# Patient Record
Sex: Male | Born: 1935 | Race: White | Hispanic: No | Marital: Married | Smoking: Former smoker
Health system: Southern US, Community
[De-identification: ages and names within clinical notes are randomized; demographics above are authoritative.]

## PROBLEM LIST (undated history)

## (undated) DIAGNOSIS — C189 Malignant neoplasm of colon, unspecified: Secondary | ICD-10-CM

## (undated) DIAGNOSIS — I2699 Other pulmonary embolism without acute cor pulmonale: Secondary | ICD-10-CM

## (undated) DIAGNOSIS — Z96659 Presence of unspecified artificial knee joint: Secondary | ICD-10-CM

## (undated) DIAGNOSIS — M1990 Unspecified osteoarthritis, unspecified site: Secondary | ICD-10-CM

## (undated) DIAGNOSIS — I493 Ventricular premature depolarization: Secondary | ICD-10-CM

## (undated) DIAGNOSIS — C801 Malignant (primary) neoplasm, unspecified: Secondary | ICD-10-CM

## (undated) DIAGNOSIS — G4730 Sleep apnea, unspecified: Secondary | ICD-10-CM

## (undated) DIAGNOSIS — Z9889 Other specified postprocedural states: Secondary | ICD-10-CM

## (undated) DIAGNOSIS — E039 Hypothyroidism, unspecified: Secondary | ICD-10-CM

## (undated) DIAGNOSIS — I10 Essential (primary) hypertension: Secondary | ICD-10-CM

## (undated) DIAGNOSIS — G4700 Insomnia, unspecified: Secondary | ICD-10-CM

## (undated) DIAGNOSIS — R4702 Dysphasia: Secondary | ICD-10-CM

## (undated) DIAGNOSIS — E785 Hyperlipidemia, unspecified: Secondary | ICD-10-CM

## (undated) HISTORY — PX: OTHER SURGICAL HISTORY: SHX169

## (undated) HISTORY — DX: Dysphasia: R47.02

## (undated) HISTORY — PX: THYROIDECTOMY: SHX17

## (undated) HISTORY — DX: Insomnia, unspecified: G47.00

## (undated) HISTORY — DX: Sleep apnea, unspecified: G47.30

## (undated) HISTORY — PX: CARPAL TUNNEL RELEASE: SHX101

## (undated) HISTORY — DX: Other specified postprocedural states: Z98.890

## (undated) HISTORY — DX: Ventricular premature depolarization: I49.3

## (undated) HISTORY — DX: Hypothyroidism, unspecified: E03.9

## (undated) HISTORY — DX: Essential (primary) hypertension: I10

## (undated) HISTORY — DX: Hyperlipidemia, unspecified: E78.5

## (undated) HISTORY — PX: TONSILLECTOMY AND ADENOIDECTOMY: SHX28

## (undated) HISTORY — PX: TOTAL HIP ARTHROPLASTY: SHX124

## (undated) HISTORY — DX: Presence of unspecified artificial knee joint: Z96.659

---

## 2000-04-13 ENCOUNTER — Ambulatory Visit (HOSPITAL_COMMUNITY): Admission: RE | Admit: 2000-04-13 | Discharge: 2000-04-13 | Payer: Self-pay | Admitting: Gastroenterology

## 2000-04-13 ENCOUNTER — Encounter (INDEPENDENT_AMBULATORY_CARE_PROVIDER_SITE_OTHER): Payer: Self-pay | Admitting: *Deleted

## 2000-04-21 ENCOUNTER — Encounter: Payer: Self-pay | Admitting: General Surgery

## 2000-04-25 ENCOUNTER — Inpatient Hospital Stay (HOSPITAL_COMMUNITY): Admission: RE | Admit: 2000-04-25 | Discharge: 2000-04-30 | Payer: Self-pay | Admitting: General Surgery

## 2000-04-25 ENCOUNTER — Encounter (INDEPENDENT_AMBULATORY_CARE_PROVIDER_SITE_OTHER): Payer: Self-pay | Admitting: Specialist

## 2000-09-05 ENCOUNTER — Ambulatory Visit (HOSPITAL_COMMUNITY): Admission: RE | Admit: 2000-09-05 | Discharge: 2000-09-05 | Payer: Self-pay | Admitting: Hematology & Oncology

## 2000-09-05 ENCOUNTER — Encounter: Payer: Self-pay | Admitting: Hematology & Oncology

## 2001-01-10 HISTORY — PX: APPENDECTOMY: SHX54

## 2001-01-22 ENCOUNTER — Ambulatory Visit (HOSPITAL_BASED_OUTPATIENT_CLINIC_OR_DEPARTMENT_OTHER): Admission: RE | Admit: 2001-01-22 | Discharge: 2001-01-22 | Payer: Self-pay | Admitting: Family Medicine

## 2001-04-30 ENCOUNTER — Ambulatory Visit (HOSPITAL_COMMUNITY): Admission: RE | Admit: 2001-04-30 | Discharge: 2001-04-30 | Payer: Self-pay | Admitting: Gastroenterology

## 2004-09-27 ENCOUNTER — Ambulatory Visit: Payer: Self-pay | Admitting: Pulmonary Disease

## 2005-05-03 ENCOUNTER — Ambulatory Visit (HOSPITAL_BASED_OUTPATIENT_CLINIC_OR_DEPARTMENT_OTHER): Admission: RE | Admit: 2005-05-03 | Discharge: 2005-05-03 | Payer: Self-pay | Admitting: Pulmonary Disease

## 2005-05-17 ENCOUNTER — Ambulatory Visit: Payer: Self-pay | Admitting: Pulmonary Disease

## 2005-05-23 ENCOUNTER — Ambulatory Visit: Payer: Self-pay | Admitting: Pulmonary Disease

## 2005-09-23 ENCOUNTER — Ambulatory Visit: Payer: Self-pay | Admitting: Pulmonary Disease

## 2006-01-10 DIAGNOSIS — Z9889 Other specified postprocedural states: Secondary | ICD-10-CM

## 2006-01-10 HISTORY — DX: Other specified postprocedural states: Z98.890

## 2006-07-31 LAB — LIPID PANEL
Cholesterol: 145 mg/dL (ref 0–200)
HDL: 36 mg/dL (ref 35–70)
LDL Cholesterol: 70 mg/dL
Triglycerides: 196 mg/dL — AB (ref 40–160)

## 2006-08-07 ENCOUNTER — Inpatient Hospital Stay (HOSPITAL_BASED_OUTPATIENT_CLINIC_OR_DEPARTMENT_OTHER): Admission: RE | Admit: 2006-08-07 | Discharge: 2006-08-07 | Payer: Self-pay | Admitting: Cardiovascular Disease

## 2006-08-07 HISTORY — PX: CARDIAC CATHETERIZATION: SHX172

## 2007-04-18 LAB — BASIC METABOLIC PANEL: Glucose: 125 mg/dL

## 2008-04-08 HISTORY — PX: TOTAL KNEE ARTHROPLASTY: SHX125

## 2009-08-17 ENCOUNTER — Ambulatory Visit: Payer: Self-pay | Admitting: Cardiovascular Disease

## 2010-01-27 ENCOUNTER — Encounter: Payer: Self-pay | Admitting: Cardiovascular Disease

## 2010-01-27 DIAGNOSIS — Z96659 Presence of unspecified artificial knee joint: Secondary | ICD-10-CM | POA: Insufficient documentation

## 2010-01-27 DIAGNOSIS — R4702 Dysphasia: Secondary | ICD-10-CM | POA: Insufficient documentation

## 2010-01-27 DIAGNOSIS — I493 Ventricular premature depolarization: Secondary | ICD-10-CM | POA: Insufficient documentation

## 2010-01-27 DIAGNOSIS — E039 Hypothyroidism, unspecified: Secondary | ICD-10-CM | POA: Insufficient documentation

## 2010-01-27 DIAGNOSIS — I10 Essential (primary) hypertension: Secondary | ICD-10-CM | POA: Insufficient documentation

## 2010-01-27 DIAGNOSIS — E785 Hyperlipidemia, unspecified: Secondary | ICD-10-CM | POA: Insufficient documentation

## 2010-01-27 DIAGNOSIS — G4730 Sleep apnea, unspecified: Secondary | ICD-10-CM | POA: Insufficient documentation

## 2010-02-17 ENCOUNTER — Ambulatory Visit (INDEPENDENT_AMBULATORY_CARE_PROVIDER_SITE_OTHER): Payer: Medicare Other | Admitting: Cardiovascular Disease

## 2010-02-17 DIAGNOSIS — R0681 Apnea, not elsewhere classified: Secondary | ICD-10-CM

## 2010-02-17 DIAGNOSIS — I119 Hypertensive heart disease without heart failure: Secondary | ICD-10-CM

## 2010-05-20 ENCOUNTER — Encounter: Payer: Self-pay | Admitting: Cardiovascular Disease

## 2010-05-25 NOTE — H&P (Signed)
NAME:  [name], [name]                  ACCOUNT NO.:  [id number]   MEDICAL RECORD NO.:  [id number]           PATIENT TYPE:   LOCATION:                                 FACILITY:   PHYSICIAN:  Vesta Mixer, M.D. DATE OF BIRTH:  [birth year]   DATE OF ADMISSION:  DATE OF DISCHARGE:                              HISTORY & PHYSICAL   DATE OF OFFICE VISIT:  July 31, 2006.   Victor Lucero is an elderly gentleman with a history of chest pains,  hypertension, and obstructive sleep apnea.  He has had a borderline  Cardiolite study in the past and has continued to have episodes of chest  pain.  He is admitted now for heart catheterization for further  evaluation.   Victor Lucero is a middle-aged gentleman with a history of chest pains for the  past several months.  He has a history of cigarette smoking but quit  approximately 30 years ago.  He has had some intermittent episodes of  chest pain that typically occur when he is sitting.  They seem to  radiate through to his back.  They are not necessarily associated with  eating, drinking, change in position, taking a deep breath, or exercise.  We performed a stress Cardiolite study which revealed a small amount of  anterolateral redistribution.  We have tried him on an acid blocker, and  he got some initial relief, but now his chest pains have returned.  Because of these episodes of chest pain, we have decided to proceed with  heart catheterization.  He denies any PND or orthopnea.  He denies any  syncope or presyncope.   CURRENT MEDICATIONS:  1. Levothyroxine 0.15 mg a day.  2. Lisinopril/HCT 20 mg /12.5 mg a day - 1/2 tablet a day.  3. Xalatan eye drops both eyes at bedtime.  4. Tramadol 2 tablets twice a day.  5. Nabumetone 750 mg q.12 hours.  6. Osteo Bi-Flex at bedtime.  7. Aspirin 81 mg a day.  8. Prilosec once a day.  9. Simvastatin once a day.  10.Vitamin D once a day.   He is allergic to PENICILLIN, which causes a rash.   PAST MEDICAL  HISTORY:  1. Hypercholesterolemia.  2. Chest pains.  3. Hypertension.  4. Sleep apnea.   SOCIAL HISTORY:  The patient has a remote history of cigarette smoking.  The patient is retired.   FAMILY HISTORY:  Reveals that his father died at age 1 due to thyroid  cancer.  His father died of old age.  His mother died at age 68 due to a  myocardial infarction.   His review of systems was reviewed and is essentially negative, except  for as noted in the HPI.   PHYSICAL EXAMINATION:  GENERAL:  He is a middle-aged gentleman in no  acute distress.  He is alert and oriented x3, and his mood and affect  are normal.  VITAL SIGNS:  His weight is 274, blood pressure is 134/80, with heart  rate of 80.  HEENT:  Reveals 2+ carotids.  He has  no bruits,  no JVD, no thyromegaly.  LUNGS:  Clear to auscultation.  HEART:  Regular rate, S1, S2.  ABDOMEN:  Reveals good bowel sounds and is nontender.  EXTREMITIES:  Have no clubbing, cyanosis, or edema.  NEUROLOGIC:  Nonfocal.   His EKG reveals normal sinus rhythm.  It was otherwise normal.   A stress Cardiolite study reveals anterolateral ischemia.   Mr. Walgren presents with some rather atypical chest pains but a borderline  stress Cardiolite study.  Because of his persistent chest pain and the  borderline stress Cardiolite study, we will proceed with heart  catheterization.  We have discussed the risks, benefits, and options of  heart catheterization.  He understands and agrees to proceed.           ______________________________  Vesta Mixer, M.D.     PJN/MEDQ  D:  07/31/2006  T:  08/01/2006  Job:  [id number]   cc:   Almedia Balls

## 2010-05-25 NOTE — Cardiovascular Report (Signed)
NAME:  Victor Lucero, Victor Lucero                  ACCOUNT NO.:  [id number]   MEDICAL RECORD NO.:  [id number]          PATIENT TYPE:  OIB   LOCATION:  1962                         FACILITY:  [facility]   PHYSICIAN:  Vesta Mixer, M.D. DATE OF BIRTH:  07/19/1935   DATE OF PROCEDURE:  08/07/2006  DATE OF DISCHARGE:                            CARDIAC CATHETERIZATION   HISTORY:  Victor Lucero is a 75 year old gentleman with a history of chest  pains.  He had a borderline stress Cardiolite study in the past.  He is  referred now for a heart catheterization based on his symptoms.   PROCEDURE:  left heart catheterization with coronary angiography.  The  right femoral artery was easily cannulated using modified Seldinger  technique.  He had a very tortuous right iliac artery.  We eventually  had to place a long 5-French catheter in order to cannulate the  coronaries.  We did not have any support for manipulating the catheters  without the long 5-French catheter.   HEMODYNAMIC RESULTS:  LV pressure is 140/50 with an aortic pressure of  140/65.   ANGIOGRAPHY:  Left main:  The left main is fairly normal.   The left anterior descending artery is smooth and normal.  There is a  large diagonal vessel which is normal.  Left circumflex artery is a  fairly normal branch.  It gives off a very high obtuse marginal branch.  Following this, there is a proximal 20% stenosis.  This vessel is fairly  large and this 20% stenosis is not hemodynamically significant.   The second obtuse marginal is a large and normal branch.   The right coronary artery is moderate in size and is dominant.  It is  normal throughout its course.  The posterolateral branch and the  posterior descending artery are normal.   The left ventriculogram was performed in the 30 RAO position.  It  reveals overall normal left ventricular systolic function.  Ejection  fraction is approximately 65%.   COMPLICATIONS:  None.   CONCLUSIONS:  1. Minimal  coronary artery irregularities.  He has a very slight 20%      stenosis in the proximal aspect of the circumflex artery.      Otherwise his vessels are fairly normal.  He has normal left      ventricular systolic function.  His heart may be at the upper      limits of normal in size.           ______________________________  Vesta Mixer, M.D.    PJN/MEDQ  D:  08/07/2006  T:  08/07/2006  Job:  [id number]   cc:   Almedia Balls

## 2010-05-28 NOTE — Procedures (Signed)
NAME:  Victor Lucero, Victor Lucero                  ACCOUNT NO.:  [id number]   MEDICAL RECORD NO.:  [id number]          PATIENT TYPE:  OUT   LOCATION:  SLEEP CENTER                 FACILITY[facility]  [facility]   PHYSICIAN:  Marcelyn Bruins, M.D. [facility] DATE OF BIRTH:  [birth year]   DATE OF STUDY:  05/03/2005                              NOCTURNAL POLYSOMNOGRAM   REFERRING PHYSICIAN:  Dr. Marcelyn Bruins.   INDICATIONS FOR STUDY:  Hypersomnia with sleep apnea.   EPWORTH SCORE:  Nine.   SLEEP ARCHITECTURE:  The patient had total sleep time of 323 minutes with  decreased REM and never achieved slow wave sleep.  Sleep onset latency was  at the upper limits of normal and REM onset was prolonged.  Sleep efficiency  was decreased at 70%.   RESPIRATORY DATA:  The patient was found to have 203 hypopneas and 98  obstructive apneas for a respiratory disturbance index of 56 events per  hour.  The events were not positional and there was moderate to loud snoring  noted throughout.   OXYGEN DATA:  There was O2 desaturation as low as 82% with the patient's of  obstructive events.   CARDIAC DATA:  Frequent PVCs were noted throughout.   MOVEMENT/PARASOMNIAS:  The patient was found to have 80 leg jerks with  little to no sleep disruption.   IMPRESSION/RECOMMENDATIONS:  1.  Severe obstructive sleep apnea/hypopnea syndrome with a respiratory      disturbance index of 56 events per hour and O2 desaturation as low as      82%.  Treatment for this degree of sleep apnea should focus primarily on      weight loss as well as CPAP.  2.  Frequent PACs were noted throughout.  Clinical correlation is suggested.           ______________________________  Marcelyn Bruins, M.D. [facility]  Diplomate, American Board of Sleep  Medicine     KC/MEDQ  D:  05/17/2005 15:27:03  T:  05/18/2005 14:02:40  Job:  [id number]

## 2010-05-28 NOTE — H&P (Signed)
[place]. [facility]  Patient:    Victor Lucero, Victor Lucero                         MRN: [id number] Adm. Date:  [id number] Disc. Date: [id number] Attending:  Henrene Dodge CC:         Anselm Pancoast. Zachery Dakins, M.D.   History and Physical  CHIEF COMPLAINT: Lesion, tumor of cecum.  HISTORY OF PRESENT ILLNESS: Victor Lucero is a 75 year old Caucasian male referred to me by Dr. Reece Agar for excision of an abnormal mass located at the cecum or base of the appendix that was found on colonoscopy.  The patient has had bright red blood in his bowel movements and was undergoing colonoscopy by Dr. Laural Benes with findings of a bleeding diverticulum that was clipped.  Full colonoscopy was performed with a lesion that appeared to be originate from the orifice of the appendix that looked smooth and sort of benign appearance, and a biopsy was not confirmatory or even really suspicious for adenocarcinoma.  I saw the patient in the office several days later and on physical examination the patient denied any blockage, nausea, or vomiting-type symptoms.  He was having vague pain over the right lower quadrant but the pain kind of radiates to his testicles, sort of like he has got a direct inguinal hernia, and he does have a little weakness in the right inguinal area.  I recommended that we proceed on with excision of this lesion.  Hopefully we can excise it, basically removing the appendix and the tumor since it is kind of up in the big portion of the cecum, and have pathology examination performed. If this is a malignancy he will need a right colectomy.  If it is benign then hopefully that is all he would need, and it would require a lot less of an operation.  The patient is in agreement with this.  CURRENT MEDICATIONS:  1. Synthroid replacement 150 mg q.d.  2. Eye drops.  3. Hydrochlorothiazide half tablet q.d.  He denies problems with hypertension or cardiac  problems.  ALLERGIES: He states he is allergic to PENICILLIN and it causes a rash.  He underwent erythromycin and Neomycin following a GoLYTELY bowel prep in preparation for the surgery.  Past medical history is significant in that he denies family history of colon tumors and besides diverticulosis bleeding he has not had any problems with the exception that he has had some skin tumors removed and he had a thyroidectomy back in 1967.  Dr. Karie Chimera is the patients regular physician.  PHYSICAL EXAMINATION:  GENERAL: He is a pleasant, overweight male.  He appears his stated age and is in no acute distress.  VITAL SIGNS: Temperature 97.5 degrees, heart rate 80, respirations 16, blood pressure 144/82.  Height 5 feet 11 inches.  Weight 268 pounds.  HEENT: Unremarkable.  Well-healed thyroid incision.  No masses.  No supraclavicular or cervical lymphadenopathy.  LUNGS: Clear.  CARDIAC: Normal sinus rhythm.  No murmurs, rubs, or gallops.  ABDOMEN: Obese, soft.  There is no definite umbilical weakness noted.  With straining there is a little bit of a give on the right groin area but not really anything extending through the external ring or to the scrotum.  RECTAL: Examination unremarkable.  I do not appreciate any weakness on the left inguinal area.  EXTREMITIES: Unremarkable.  CNS: Physiologic.  ADMISSION IMPRESSION: Tumor of cecum, thought possibly carcinoid, thought possibly to be a  polyp originating at the base of the appendix.  PLAN: The patient will undergo excision of this and then possibly a right colectomy. DD:  04/28/00 TD:  04/30/00 Job: [id number]

## 2010-05-28 NOTE — Procedures (Signed)
[facility]  Patient:    Victor Lucero, Victor Lucero Visit Number: [id number] MRN: [id number]          Service Type: END Location: ENDO Attending Physician:  Dennison Bulla Ii Dictated by:   Verlin Grills, M.D. Proc. Date: 04/30/01 Admit Date:  04/30/2001   CC:         Al Decant. Janey Greaser, M.D.   Procedure Report  PROCEDURE:  Screening colonoscopy.  REFERRING PHYSICIAN:  Al Decant. Janey Greaser, M.D.  PROCEDURE INDICATION:  Mr. Arda Keadle is a 74 year old male born 1935-07-13.  Approximately one year ago, Mr. Canepa underwent a segmental right colon resection to remove a colonic cancer.  Mr. Uselman is due for repeat screening colonoscopy with polypectomy to prevent recurrent colon cancer.  ENDOSCOPIST:  Verlin Grills, M.D.  PREMEDICATION:  Versed 5 mg, Demerol 50 mg.  ENDOSCOPE:  Olympus pediatric colonoscope.  DESCRIPTION OF PROCEDURE:  After obtaining informed consent, Mr. Bulnes was placed in the left lateral decubitus position.  I administered intravenous Demerol and intravenous Versed to achieve conscious sedation for the procedure.  The patients blood pressure, oxygen saturation, and cardiac rhythm were monitored throughout the procedure and documented in the medical record.  Anal inspection was normal.  Digital rectal exam was normal.  The Olympus pediatric video colonoscope was introduced into the rectum and advanced to the ileal right colonic surgical anastomosis.  Colonic preparation for the exam today was satisfactory.  RECTUM:  Normal.  SIGMOID COLON AND DESCENDING COLON:  Left colonic diverticulosis.  SPLENIC FLEXURE:  Normal.  TRANSVERSE COLON:  Normal.  HEPATIC FLEXURE:  Normal.  ASCENDING COLON:  Normal.  ILEAL RIGHT COLONIC SURGICAL ANASTOMOSIS:  Normal.  ASSESSMENT:  Left colonic diverticulosis; otherwise normal proctocolonoscopy to the ileal right colonic surgical anastomosis.  No endoscopic evidence for the  presence of recurrent colorectal neoplasia.  RECOMMENDATIONS:  Repeat colonoscopy in three years. Dictated by:   Verlin Grills, M.D. Attending Physician:  Dennison Bulla Ii DD:  04/30/01 TD:  04/30/01 Job: [id number]

## 2010-05-28 NOTE — Discharge Summary (Signed)
[place]. [facility]  Patient:    Victor Lucero, Victor Lucero                         MRN: [id number] Adm. Date:  [id number] Disc. Date: [id number] Attending:  Henrene Dodge CC:         Verlin Grills, M.D.  Al Decant. Janey Greaser, M.D.   Discharge Summary  DISCHARGE DIAGNOSIS:  Carcinoma of the cecum, Dukes B.  PROCEDURE:  Right colectomy.  HISTORY OF PRESENT ILLNESS:  Mr. Jeshawn Lucero is a 75 year old slightly overweight, Caucasian male referred to me by Verlin Grills, M.D. after a recent colonoscopy for GI bleeding and noted an area that appeared to be about a 3 cm mass and looked like it was arising from the appendices orifice, that looked smooth and the biopsy was not suspicious for carcinoma.  Dr. Laural Benes gave me pictures and on review of this, we were kind of wondering if this could possibly be a carcinoid or some type of growth and we thought it definitely needed to be excised with laparotomy, but whether it was going to need a local excision or possibly a right colectomy, we could not determine. The patient really gives no history of obstructed-type symptoms, but has had intermittent discomfort down in the right groin sort of like a right inguinal hernia.  On examination, he has a little bit of weakness noted, but not that of an obvious large direct inguinal hernia.  I recommended that we do a right transverse incision and then would make the decision of whether or not he needed a larger operation after the area was excised.  His laboratory studies, of course he has been recently transfused, showed that he was not significantly anemic.  The patient was taken to surgery and had a GoLytely bowel prep and then erythromycin and Neomycin with Dr. Danella Sensing assist.  We did a kind of a [facility] type incision below the umbilicus.  The lesion, after the cecum was mobilized, was in the area opposite to the ileocecal valve area and we were able to  transect the area with a portion of the cecum.  The area was tuckered a little bit and was noted not to arise really within the appendix, but immediately adjacent to the appendix.  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  The pathologist was a little questionable whether or not there was frank malignancy or not on a frozen examination, but because of definitely some atypical cells, we elected to go ahead and do a right colectomy after extending the incision.  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 There were 20 lymph nodes in the excised specimen, none of which showed metastatic disease, and the tumor was growing into the bowel wall, but not through the bowel wall.  Postoperatively, the patient did satisfactorily, was able to void the day after surgery after his Foley catheter was removed, was started on a limited liquid diet on approximately the second postoperative day and then this was advanced and on the fifth postoperative day, he was tolerating solid food, incision appeared to be healing okay and was discharged by Dr. Ezzard Standing who was making weekend rounds.  The patient was instructed to see me in the office for a wound  check and staple removal in approximately three to four days and understands that he should not be doing any strenuous lifting for approximately four weeks.  At the time of surgery, he has a little direct weakness in the right inguinal area, but not really a large symptomatic hernia.  This will probably develop into a hernia over the future and I expect the little pain that he is having intermittently in the right lower quadrant is related to the weakness in the right inguinal area.  The patient has Tylox for postoperative pain and he resumes his  chronic medications.  He is on hydrochlorothiazide for mild hypertension and Synthroid 150 mcg daily and Pravachol. DD:  05/17/00 TD:  05/17/00 Job: [id number]

## 2010-05-28 NOTE — Op Note (Signed)
[place]. [facility]  Patient:    Victor Lucero, Victor Lucero                         MRN: [id number] Proc. Date: 04/25/00 Adm. Date:  [id number] Attending:  Henrene Dodge                           Operative Report  PREOPERATIVE DIAGNOSIS:  Cecal mass right at the base of the appendix, possible carcinoid.  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  PROCEDURES: 1. Excisional biopsy of the cecal mass. 2. Then we proceeded on with a right colectomy.  ANESTHESIA:  General anesthesia.  SURGEON:  Anselm Pancoast. Zachery Dakins, M.D.  ASSISTANT:  Donnie Coffin. Samuella Cota, M.D.  HISTORY:  Victor Lucero is a 75 year old slightly overweight Caucasian male, who was referred to me by Verlin Grills, M.D., after on a recent colonoscopy for GI bleeding the patient was noted to have a bleeding area in the sigmoid colon that was clipped, but on a full colonoscopy there was an area, about a 3 cm mass that looks like it is arising out of the base of the appendix that looked smooth, looked kind of benign, and this was biopsied without confirmation of any definite diagnosis, and the patient was referred to me.  His pictures looked like this was kind of a smooth, not the typical malignant-type appearing area, and well-localized, and I recommended that, one, we do a limited operation first and excise the area and if this was confirmed to be a malignancy or really a polyp undergoing a malignancy, then proceed on with a right colectomy.  The patient was in agreement with this. He has had some vague symptoms in the right lower quadrant, but the symptoms that he is having are more symptomatic of a developing direct inguinal hernia that is really not a significant bulge at this time, but with groin discomfort and pain going to his testicle.  I did an erythromycin-neomycin bowel prep after GoLYTELY, and he is an a.m. admission  for this planned procedure.  DESCRIPTION OF PROCEDURE:  The patient was taken to the operative suite, induction of general anesthesia, endotracheal tube, a Foley catheter was placed sterilely, and then the patient has a very large abdomen.  An appendectomy-type transverse McBurney incision or Rockey-Davis type incision was made.  He has a broad right rectus muscle and upon opening this in the peritoneal cavity, you could see the appendix.  It looked unremarkable.  The cecum itself looked negative, but when you actually had it up exposed, you could see there was definitely a little indentation tucking in the area, which was about 3-4 cm in size.  This area, we first freed up the appendiceal mesentery and then took a GI TA-60 and put it under this lesion right through the base of the appendix, then fired it, then removed the area.  On looking at it, it looked different from the colonoscopy appearance, and it looked more like an early cancer with kind of ulceration and irregular borders, where at the time of endoscopy it looked much more benign-appearing.  Dr. Clelia Croft was the pathologist, and she received the specimen and said that really on a frozen, she was not sure whether she would be able to tell whether it definitely was or was not a true malignancy.  On the frozen, however, after several  specimens, she said it was a tubulovillous adenoma with several areas of really high dysplasia and kind of left it up to [place] on whether it was an early cancer or not.  I thought that really from the external appearance with this tuck-in and what we were feeling and seeing at the time of surgery, it certainly felt and looked more like a malignancy from the outside surface, to go ahead and do a right colectomy.  We then extended the incision.  It was transverse, kind of below the umbilicus, dividing the right rectus, and carrying it just a little bit past the midline.  I then put a Thompson retractor in the  right upper quadrant, since he is large, and then we mobilized the lateral cecum, freed the hepatic flexure of the colon from the overlying abundant omentum, and then basically divided, lifting up the mesentery and then dividing the mesentery between [facility], and then these pedicles were tied singly and doubly with 2-0 silk.  After the colon was freed, we elected to do a GIA TA-60 stapled anastomosis, kind of a side-to-side with the triangulation area, and the specimen was sent for permanent exam.  The mesenteric defect was closed with interrupted sutures of 3-0 silk.  The small bowel was run in good anatomical position.  The omentum was then brought down over this anastomosis, and the inspection revealed no evidence of any bleeding.  The small bowel was in gentle loops, and then we closed the fascia with #1 PDS in the inner layer and then #1 PDS with interrupted 2-0 Prolene sutures in the anterior fascia.  He had about a quarter-size umbilical hernia that I freed up and closed this with interrupted sutures of 0 Prolene.  The skin after being irrigated was closed with staples, and a few 3-0 Vicryl sutures were placed in the deep adipose tissue.  The patient tolerated the procedure nicely and had an oral tube in the stomach.  I think we will leave him n.p.o. but not really put an NG unless he is nauseated.  Sponge and needle counts were correct.  The estimated blood loss was probably 200 cc or so, and the patient was extubated, sent to the recovery room in stable condition. DD:  04/25/00 TD:  04/26/00 Job: [id number]

## 2010-05-28 NOTE — Procedures (Signed)
[place]  Patient:    Victor Lucero, Victor Lucero                           MRN: [id number] Proc. Date: 04/13/00 Attending:  Verlin Grills, M.D. CC:         Al Decant. Janey Greaser, M.D.   Procedure Report  DATE OF BIRTH:  10-Aug-1935  REFERRING PHYSICIAN:  Al Decant. Janey Greaser, M.D.  PROCEDURE PERFORMED:  Colonoscopy.  ENDOSCOPIST:  Verlin Grills, M.D.  INDICATIONS FOR PROCEDURE:  The patient is a 75 year old male with painless hematochezia.  Mr. Segreto denies a personal or family history of colon cancer. He underwent his health maintenance flexible proctosigmoidoscopy, September 07, 1999 which revealed colonic diverticulosis but no endoscopic evidence for the presence of colorectal neoplasia.  Mr. Clos viewed our colonoscopy education film in my office.  I discussed with the patient the complications associated with colonoscopy and polypectomy including a 1 per 1000 risk of bleeding and 4 per 1000 risk of colonic rupture requiring emergency surgery.  The patient has signed the operative permit.  ALLERGIES:  PENICILLIN.  CHRONIC MEDICATIONS:  Luvox, Synthroid, Lipitor, glaucoma eyedrops, Vioxx, glucosamine and Advil.  PAST MEDICAL HISTORY:  Glaucoma, arthritis, bilateral rotator cuff tears, diverticulitis, depression and anxiety, thyroidectomy, skin cancer removal, hypertension, hyperlipidemia.  HABITS:  Mr. Kerkhoff does not smoke cigarettes.  He consumes alcohol in moderation.  SOCIAL HISTORY:  Mr. Sturgell is married.  His 27 year old wife is in fair health. His 84 year old son, 29 year old daughter, and 15 year old son are in excellent health.  FAMILY HISTORY:  Father died age 72 of heart disease.  Mother died age 66 of heart disease and Parkinsonism.  The patients 65 year old brother has Parkinsonism.  His 30 year old sister died with heart disease.  His 35 year old sister died with a brain tumor.  His 7 year old sister died of pancreatic  cancer.  PREMEDICATION:  Versed 5 mg, fentanyl 50 mcg.  ENDOSCOPE:  Olympus video colonoscope.  DESCRIPTION OF PROCEDURE:  After obtaining informed consent, the patient was placed in the left lateral decubitus position.  I administered intravenous fentanyl and intravenous Versed to achieve conscious sedation for the procedure.  The patients blood pressure, oxygen saturation and cardiac rhythm were monitored throughout the procedure and documented in the medical record.  Anal inspection was normal.  Digital rectal examination revealed a non-nodular prostate.  The Olympus pediatric video colonoscope was then introduced into the rectum and under direct vision, easily advanced to the cecum as identified by a normal-appearing ileocecal valve.  Colonic preparation for the exam today was excellent.  Rectum:  Normal.  Sigmoid colon and descending colon:  Left colonic diverticulosis.  At approximately 40 cm from the anal verge there was an inverted diverticulum with stigmata of recent bleeding suggesting a visible vessel.  This area was endoclipped.  Marland Kitchen  Splenic flexure:  Normal.  Transverse colon:  Normal.  Hepatic flexure:  Normal.  Ascending colon:  Normal.  Cecum and ileocecal valve:  Extending from the appendiceal orifice was a 2 cm polypoid lesion.  Multiple biopsies were taken.  This could either be a carcinoid tumor or an adenoma--adenocarcinoma.  This lesion will need to be surgically removed.  ASSESSMENT: 1. Extensive left colonic diverticulosis. 2. From the sigmoid colon, at 40 cm from the anal verge a bleeding inverted    diverticulum was endoclipped. 3. From the cecum, extending from the area of the appendiceal orifice, there  was a 2 cm polypoid lesion which was biopsied and will need to be removed    surgically.  This may represent a carcinoid tumor or an adenomatous-    adenocarcinomatous polyp. DD:  04/13/00 TD:  04/13/00 Job: [id number]

## 2010-09-23 ENCOUNTER — Ambulatory Visit: Payer: Medicare Other | Admitting: Cardiovascular Disease

## 2010-11-01 ENCOUNTER — Ambulatory Visit (INDEPENDENT_AMBULATORY_CARE_PROVIDER_SITE_OTHER): Payer: Medicare Other | Admitting: Cardiovascular Disease

## 2010-11-01 ENCOUNTER — Encounter: Payer: Self-pay | Admitting: Cardiovascular Disease

## 2010-11-01 DIAGNOSIS — I10 Essential (primary) hypertension: Secondary | ICD-10-CM

## 2010-11-01 DIAGNOSIS — G4730 Sleep apnea, unspecified: Secondary | ICD-10-CM

## 2010-11-01 NOTE — Progress Notes (Signed)
Victor Lucero Date of Birth  12-17-1935 [facility] [address]    [address] [place], [place]  [address] [phone]  Fax  [phone]  History of Present Illness:  By the is a 75 year old gentleman with a history of hypertension, hypothyroidism, hyperlipidemia, sleep apnea, and a recent hip and knee replacement.   Has not gotten any good sleep for the past several years. Because of the insomnia and sleep apnea, he has not had a restful night in years.  He has an appointment with Dr. Richardean Chimera to make sure that his sleep mask is fitting correctly.    Has frequent episodes of shortness breath. He thinks that it's all because he is not in good shape.   Current Outpatient Prescriptions on File Prior to Visit  Medication Sig Dispense Refill  . aspirin 81 MG tablet Take 81 mg by mouth daily.        . diphenhydrAMINE (BENADRYL) 50 MG capsule Take 50 mg by mouth every 6 (six) hours as needed.        . latanoprost (XALATAN) 0.005 % ophthalmic solution Place 1 drop into both eyes at bedtime.        Marland Kitchen levothyroxine (SYNTHROID, LEVOTHROID) 150 MCG tablet Take 150 mcg by mouth daily.        Marland Kitchen omeprazole (PRILOSEC OTC) 20 MG tablet Take 40 mg by mouth daily.        Marland Kitchen zolpidem (AMBIEN) 10 MG tablet Take 10 mg by mouth at bedtime as needed.          Allergies  Allergen Reactions  . Penicillins Rash    Past Medical History  Diagnosis Date  . Hypertension   . Hypothyroidism   . Hyperlipemia   . Sleep apnea     CPAP  . S/P TKR (total knee replacement)     R KNEE  . Dysphasia   . PVC (premature ventricular contraction)     Past Surgical History  Procedure Date  . Total knee arthroplasty 04/08/08    R KNEE  . Cardiac catheterization 08/07/06  . Thyroidectomy 1970'S  . Appendectomy 2003  . Bowel     CANCER REMOVAL  . Tonsillectomy and adenoidectomy     AGE 6    History  Smoking status  . Former Smoker -- 2.0 packs/day for 10 years  . Types: Cigarettes  . Quit date:  01/11/1975  Smokeless tobacco  . Not on file    History  Alcohol Use  . Yes    WINE/BEER OCCASIONALLY    Family History  Problem Relation Age of Onset  . Cancer Father     THYROID CA-DECEASED  . Stroke Father     DECEASED  . Heart attack Mother     DECEASED  . Hypertension Mother     DECEASED  . Parkinsonism Brother     DECEASED  . Heart attack Brother     DECEASED  . Heart attack Sister     DECEASED  . Hypertension Sister   . Cancer Sister     BRAIN TUMOR-DECEASE  . Hypertension Sister     DECEASED  . Cancer Sister     PANCREAS-DECEASED  . Hypertension Sister     Reviw of Systems:  Reviewed in the HPI.  All other systems are negative.  Physical Exam: BP 171/96  Pulse 82  Ht 5\' 11"  (1.803 m)  Wt 303 lb 6.4 oz (137.621 kg)  BMI 42.32 kg/m2 The patient is alert and oriented x 3.  The mood and affect are normal.   Skin: warm and dry.  Color is normal.    HEENT:   Normal carotids. No JVD.  Lungs: Lungs are clear.   Heart: Regular rate S1-S2.    Abdomen: Nontender. He has good bowel sounds  Extremities:  No clubbing cyanosis or edema.  Neuro:  Nonfocal.       ECG: Normal sinus rhythm. Sinus arrhythmia.  Assessment / Plan:

## 2010-11-01 NOTE — Patient Instructions (Addendum)
Your physician wants you to follow-up in: 6 month, You will receive a reminder letter in the mail two months in advance. If you don't receive a letter, please call our office to schedule the follow-up appointment.  Your physician recommends that you continue on your current medications as directed. Please refer to the Current Medication list given to you today.

## 2010-11-01 NOTE — Assessment & Plan Note (Signed)
He'll be seeing Dr. Richardean Chimera soon to check his CPAP mask.

## 2010-11-01 NOTE — Assessment & Plan Note (Signed)
About his blood pressures elevated today. It was normal with some 6 months ago. He's complaining of a lot of pain in his hip. In addition he has not been sleeping well.  He has significant insomnia and also has sleep apnea. I've encouraged him to try to get as much exercise as possible. I've advised him to try to lose some weight.  His wife has been sick and he's had to keep out all of his meals. Occasionally he'll brings it back but it's always prepared food in the quantity of food is more than he would normally eat. We'll continue with his same medications. We'll see him in 6 months. I'll be glad to see him sooner if needed.

## 2010-11-02 ENCOUNTER — Telehealth: Payer: Self-pay | Admitting: Cardiovascular Disease

## 2010-11-02 MED ORDER — LOSARTAN POTASSIUM 100 MG PO TABS: 100.0000 mg | ORAL_TABLET | Freq: Every day | ORAL | Status: DC

## 2010-11-02 NOTE — Telephone Encounter (Signed)
Pt calling to confirm medication from yesterday's OV it is called Losartan potassium 100mg  qd

## 2010-11-02 NOTE — Telephone Encounter (Signed)
Called pt back, no HCTZ in med/ losartan, MAR changed to reflect and not refilled at this time.

## 2010-12-17 ENCOUNTER — Ambulatory Visit: Payer: Medicare Other | Attending: Neurology | Admitting: Physical Therapy

## 2010-12-17 DIAGNOSIS — R42 Dizziness and giddiness: Secondary | ICD-10-CM | POA: Insufficient documentation

## 2010-12-17 DIAGNOSIS — IMO0001 Reserved for inherently not codable concepts without codable children: Secondary | ICD-10-CM | POA: Insufficient documentation

## 2010-12-17 DIAGNOSIS — R269 Unspecified abnormalities of gait and mobility: Secondary | ICD-10-CM | POA: Insufficient documentation

## 2010-12-21 ENCOUNTER — Ambulatory Visit: Payer: Medicare Other | Admitting: Physical Therapy

## 2010-12-28 ENCOUNTER — Ambulatory Visit: Payer: Medicare Other | Admitting: Physical Therapy

## 2010-12-30 ENCOUNTER — Encounter: Payer: Medicare Other | Admitting: Physical Therapy

## 2011-01-06 ENCOUNTER — Encounter: Payer: Medicare Other | Admitting: Physical Therapy

## 2011-01-07 ENCOUNTER — Encounter: Payer: Medicare Other | Admitting: Physical Therapy

## 2011-01-12 ENCOUNTER — Encounter: Payer: Medicare Other | Admitting: Physical Therapy

## 2011-01-14 ENCOUNTER — Encounter: Payer: Medicare Other | Admitting: Physical Therapy

## 2011-03-29 ENCOUNTER — Encounter: Payer: Self-pay | Admitting: Nurse Practitioner

## 2011-03-29 ENCOUNTER — Ambulatory Visit (INDEPENDENT_AMBULATORY_CARE_PROVIDER_SITE_OTHER): Payer: Medicare Other | Admitting: Nurse Practitioner

## 2011-03-29 DIAGNOSIS — R079 Chest pain, unspecified: Secondary | ICD-10-CM | POA: Insufficient documentation

## 2011-03-29 DIAGNOSIS — I493 Ventricular premature depolarization: Secondary | ICD-10-CM

## 2011-03-29 DIAGNOSIS — I10 Essential (primary) hypertension: Secondary | ICD-10-CM

## 2011-03-29 DIAGNOSIS — I4949 Other premature depolarization: Secondary | ICD-10-CM

## 2011-03-29 LAB — BASIC METABOLIC PANEL
BUN: 22 mg/dL (ref 6–23)
CO2: 23 mEq/L (ref 19–32)
Calcium: 9.4 mg/dL (ref 8.4–10.5)
Chloride: 107 mEq/L (ref 96–112)
Creatinine, Ser: 0.9 mg/dL (ref 0.4–1.5)
GFR: 83.46 mL/min (ref >60.00)
Glucose, Bld: 117 mg/dL — ABNORMAL HIGH (ref 70–99)
Potassium: 3.8 mEq/L (ref 3.5–5.1)
Sodium: 141 mEq/L (ref 135–145)

## 2011-03-29 MED ORDER — METOPROLOL TARTRATE 25 MG PO TABS: 25.0000 mg | ORAL_TABLET | Freq: Two times a day (BID) | ORAL | Status: AC

## 2011-03-29 NOTE — Progress Notes (Signed)
Victor Lucero Date of Birth: [birth year] Medical Record #[id number]  History of Present Illness: Victor Lucero is seen today for a work in visit. He is seen for Dr. Elease Hashimoto. He has a history of HTN and HLD. Has sleep apnea as well. Has had remote cardiac cath back in 2008 showing minimal irregularities. EF was normal. Last echo in 2009 showing mild LVH with impaired LV relaxation, mild aortic sclerosis/calcification, no AS, trace TR and trace MR.   He comes in today. He is here alone. He was initially quite upset in regards to seeing me today but calmed down after some talking. He really hasn't felt all that well for the past 3 months or so. He has this fleeting chest pain that will last for about a minute. Not exertional but he notes that he doesn't really exert himself. He is under a lot of stress in caring for his wife. She has a hydrocephalus and requires total care. She is having surgery for a shunt tomorrow. He does not sleep due to chronic insomnia and reports really no good sleep for the past 20 years. He does have PVC's but no syncope. He has been having some headaches, blurred vision and dizziness. Blood pressure is grossly elevated. He has not been checking it at home. Salt use is questionable. He was at his PCP's office last week and told that there was some "problem" on his EKG. We do not have those records.   Current Outpatient Prescriptions on File Prior to Visit  Medication Sig Dispense Refill  . aspirin 81 MG tablet Take 81 mg by mouth daily.        . diphenhydrAMINE (BENADRYL) 50 MG capsule Take 50 mg by mouth daily.       Marland Kitchen latanoprost (XALATAN) 0.005 % ophthalmic solution Place 1 drop into both eyes at bedtime.        Marland Kitchen levothyroxine (SYNTHROID, LEVOTHROID) 150 MCG tablet Take 150 mcg by mouth daily.        Marland Kitchen losartan (COZAAR) 100 MG tablet Take 1 tablet (100 mg total) by mouth daily.  30 tablet  5  . omeprazole (PRILOSEC OTC) 20 MG tablet Take 20 mg by mouth daily.       Marland Kitchen DISCONTD:  zolpidem (AMBIEN) 10 MG tablet Take 10 mg by mouth at bedtime as needed.          Allergies  Allergen Reactions  . Penicillins Rash    Past Medical History  Diagnosis Date  . Hypertension   . Hypothyroidism   . Hyperlipemia   . Sleep apnea     CPAP  . S/P TKR (total knee replacement)     R KNEE  . Dysphasia   . PVC (premature ventricular contraction)   . S/P cardiac catheterization 2008    with minimal irregularities and a normal EF  . Insomnia     Past Surgical History  Procedure Date  . Total knee arthroplasty 04/08/08    R KNEE  . Cardiac catheterization 08/07/06  . Thyroidectomy 1970'S  . Appendectomy 2003  . Bowel     CANCER REMOVAL  . Tonsillectomy and adenoidectomy     AGE 32    History  Smoking status  . Former Smoker -- 2.0 packs/day for 10 years  . Types: Cigarettes  . Quit date: 01/11/1975  Smokeless tobacco  . Not on file    History  Alcohol Use  . Yes    WINE/BEER OCCASIONALLY    Family History  Problem Relation Age of Onset  . Cancer Father     THYROID CA-DECEASED  . Stroke Father     DECEASED  . Heart attack Mother     DECEASED  . Hypertension Mother     DECEASED  . Parkinsonism Brother     DECEASED  . Heart attack Brother     DECEASED  . Heart attack Sister     DECEASED  . Hypertension Sister   . Cancer Sister     BRAIN TUMOR-DECEASE  . Hypertension Sister     DECEASED  . Cancer Sister     PANCREAS-DECEASED  . Hypertension Sister     Review of Systems: The review of systems is per the HPI.  All other systems were reviewed and are negative.  Physical Exam: BP 172/104  Pulse 96  Ht 5' 11.5" (1.816 m)  Wt 304 lb (137.893 kg)  BMI 41.81 kg/m2  SpO2 96% Patient is alert and in no acute distress. He is anxious and upset. He is morbidly obese. Skin is warm and dry. Color is normal.  HEENT is unremarkable. Normocephalic/atraumatic. PERRL. Sclera are nonicteric. Neck is supple. No masses. No JVD. Lungs are clear. Cardiac  exam shows a regular rate and rhythm.Occasional ectopic noted.  Abdomen is obese but soft. Extremities are without significant edema. Gait and ROM are intact. No gross neurologic deficits noted.   LABORATORY DATA: EKG today shows sinus rhythm with PVC. Tracing is reviewed with Dr. Elease Hashimoto.    Assessment / Plan:

## 2011-03-29 NOTE — Assessment & Plan Note (Signed)
He is having some fleeting chest pain. Blood pressure needs to be addressed. He is under a lot of stress with caring for his wife. We are adding Lopressor today. If his symptoms persist, he will need further testing. His last cath in 2008 showed just minimal irregularities with a normal EF. Patient is agreeable to this plan and will call if any problems develop in the interim.

## 2011-03-29 NOTE — Patient Instructions (Signed)
We need to get your blood pressure down. I am going to put you on Lopressor 25 mg two times a day  Try to monitor some blood pressures at home.  We are going to check some lab today. I will get your records from Dr. Tresa Endo.  Dr. Elease Hashimoto and I will see you in a week.  Call the [facility] office at [phone] if you have any questions, problems or concerns.

## 2011-03-29 NOTE — Assessment & Plan Note (Signed)
Blood pressure is grossly elevated. I suspect this has been going on for a while. I think this is probably the etiology for his chest pain, headaches, dizziness and blurred vision. He is under a significant amount of stress with his wife. Hopefully she will do well with her surgery tomorrow. I am starting Mylik on Lopressor 25 mg BID. Dr. Elease Hashimoto and I will see him back in a week. He will try to check some blood pressures at home. Patient is agreeable to this plan and will call if any problems develop in the interim.

## 2011-03-29 NOTE — Assessment & Plan Note (Signed)
He has chronic PVC's. Perhaps this is what his PCP saw. We will try to get his records from his last visit. Doesn't seem to be that bothered by him. I am adding Lopressor today to help with his blood pressure.

## 2011-04-05 ENCOUNTER — Ambulatory Visit (INDEPENDENT_AMBULATORY_CARE_PROVIDER_SITE_OTHER): Payer: Medicare Other | Admitting: Nurse Practitioner

## 2011-04-05 ENCOUNTER — Encounter: Payer: Self-pay | Admitting: Nurse Practitioner

## 2011-04-05 DIAGNOSIS — I10 Essential (primary) hypertension: Secondary | ICD-10-CM

## 2011-04-05 DIAGNOSIS — G4730 Sleep apnea, unspecified: Secondary | ICD-10-CM

## 2011-04-05 DIAGNOSIS — R079 Chest pain, unspecified: Secondary | ICD-10-CM

## 2011-04-05 NOTE — Assessment & Plan Note (Signed)
Still with some chest tightness with exertion. Will update his stress test. Blood pressure is doing much better. Patient is agreeable to this plan and will call if any problems develop in the interim.

## 2011-04-05 NOTE — Assessment & Plan Note (Signed)
Blood pressure is down 30 points with the metoprolol. We will continue.

## 2011-04-05 NOTE — Patient Instructions (Addendum)
Your blood pressure looks better.  We are going to arrange for a stress test to further evaluate your chest pain  Dr. Elease Hashimoto will see you back in a month  Call the [facility] office at [phone] if you have any questions, problems or concerns.

## 2011-04-05 NOTE — Assessment & Plan Note (Signed)
This is a chronic issue along with his chronic insomnia. He says he is going to see Dr. Earl Gala next month for evaluation.

## 2011-04-05 NOTE — Progress Notes (Signed)
Victor Lucero [place] Date of Birth: 1935-07-03 Medical Record #[id number]  History of Present Illness: Victor Lucero is seen today for a follow up visit. He is seen for Dr. Elease Hashimoto. He has a history of HTN, HLD and sleep apnea. He has had remote cath back in 2008 showing minimal coronary irregularities. EF was normal. Last echo in 2009 showing mild LVH with impaired LV relaxation, mild aortic sclerosis/calcification, no AS, trace TR and trace MR. I saw him a week ago. He had multiple complaints. Blood pressure was grossly elevated. We started Metoprolol.  He comes back today. He is here alone. He is doing a little better. His headaches and vision are improving. Blood pressure is coming down. Still has a little chest tightness when he walks. Remains stressed in caring for his wife with a hydrocephalus. He also has chronic sleep insomnia and never really sleeps. He feels ok on his current medicines.  Current Outpatient Prescriptions on File Prior to Visit  Medication Sig Dispense Refill  . aspirin 81 MG tablet Take 81 mg by mouth daily.        . diphenhydrAMINE (BENADRYL) 50 MG capsule Take 50 mg by mouth daily.       Marland Kitchen latanoprost (XALATAN) 0.005 % ophthalmic solution Place 1 drop into both eyes at bedtime.        Marland Kitchen levothyroxine (SYNTHROID, LEVOTHROID) 150 MCG tablet Take 150 mcg by mouth daily.        Marland Kitchen losartan (COZAAR) 100 MG tablet Take 1 tablet (100 mg total) by mouth daily.  30 tablet  5  . metoprolol tartrate (LOPRESSOR) 25 MG tablet Take 1 tablet (25 mg total) by mouth 2 (two) times daily.  60 tablet  11  . omeprazole (PRILOSEC OTC) 20 MG tablet Take 20 mg by mouth daily.       Marland Kitchen DISCONTD: zolpidem (AMBIEN) 10 MG tablet Take 10 mg by mouth at bedtime as needed.          Allergies  Allergen Reactions  . Penicillins Rash    Past Medical History  Diagnosis Date  . Hypertension   . Hypothyroidism   . Hyperlipemia   . Sleep apnea     CPAP  . S/P TKR (total knee replacement)     R KNEE  .  Dysphasia   . PVC (premature ventricular contraction)   . S/P cardiac catheterization 2008    with minimal irregularities and a normal EF  . Insomnia     Past Surgical History  Procedure Date  . Total knee arthroplasty 04/08/08    R KNEE  . Cardiac catheterization 08/07/06  . Thyroidectomy 1970'S  . Appendectomy 2003  . Bowel     CANCER REMOVAL  . Tonsillectomy and adenoidectomy     AGE 76    History  Smoking status  . Former Smoker -- 2.0 packs/day for 10 years  . Types: Cigarettes  . Quit date: 01/11/1975  Smokeless tobacco  . Not on file    History  Alcohol Use  . Yes    WINE/BEER OCCASIONALLY    Family History  Problem Relation Age of Onset  . Cancer Father     THYROID CA-DECEASED  . Stroke Father     DECEASED  . Heart attack Mother     DECEASED  . Hypertension Mother     DECEASED  . Parkinsonism Brother     DECEASED  . Heart attack Brother     DECEASED  . Heart attack Sister  DECEASED  . Hypertension Sister   . Cancer Sister     BRAIN TUMOR-DECEASE  . Hypertension Sister     DECEASED  . Cancer Sister     PANCREAS-DECEASED  . Hypertension Sister     Review of Systems: The review of systems is positive for chest tightness with exertion.  All other systems were reviewed and are negative.  Physical Exam: BP 148/70  Pulse 56  Ht 5' 11.5" (1.816 m)  Wt 303 lb (137.44 kg)  BMI 41.67 kg/m2 Patient is very pleasant and in no acute distress. He is obese. Skin is warm and dry. Color is normal.  HEENT is unremarkable. Normocephalic/atraumatic. PERRL. Sclera are nonicteric. Neck is supple. No masses. No JVD. Lungs are clear. Cardiac exam shows a regular rate and rhythm. Abdomen is obese but soft. Extremities are without edema. Gait and ROM are intact. No gross neurologic deficits noted.  LABORATORY DATA:   Assessment / Plan:

## 2011-04-14 ENCOUNTER — Ambulatory Visit (HOSPITAL_COMMUNITY): Payer: Medicare Other | Attending: Cardiovascular Disease | Admitting: Radiology

## 2011-04-14 ENCOUNTER — Encounter (HOSPITAL_COMMUNITY): Payer: Self-pay

## 2011-04-14 DIAGNOSIS — Z87891 Personal history of nicotine dependence: Secondary | ICD-10-CM | POA: Insufficient documentation

## 2011-04-14 DIAGNOSIS — R002 Palpitations: Secondary | ICD-10-CM | POA: Insufficient documentation

## 2011-04-14 DIAGNOSIS — Z8249 Family history of ischemic heart disease and other diseases of the circulatory system: Secondary | ICD-10-CM | POA: Insufficient documentation

## 2011-04-14 DIAGNOSIS — R0789 Other chest pain: Secondary | ICD-10-CM | POA: Insufficient documentation

## 2011-04-14 DIAGNOSIS — I10 Essential (primary) hypertension: Secondary | ICD-10-CM | POA: Insufficient documentation

## 2011-04-14 DIAGNOSIS — R079 Chest pain, unspecified: Secondary | ICD-10-CM

## 2011-04-14 DIAGNOSIS — I4949 Other premature depolarization: Secondary | ICD-10-CM

## 2011-04-14 DIAGNOSIS — E785 Hyperlipidemia, unspecified: Secondary | ICD-10-CM | POA: Insufficient documentation

## 2011-04-14 MED ORDER — REGADENOSON 0.4 MG/5ML IV SOLN
0.4000 mg | Freq: Once | INTRAVENOUS | Status: AC
  Administered 2011-04-14: 0.4 mg via INTRAVENOUS

## 2011-04-14 MED ORDER — TECHNETIUM TC 99M TETROFOSMIN IV KIT
33.0000 | PACK | Freq: Once | INTRAVENOUS | Status: AC | PRN
  Administered 2011-04-14: 33 via INTRAVENOUS

## 2011-04-14 NOTE — Progress Notes (Signed)
[facility] SITE 3 NUCLEAR MED [address] [place] [address] [phone]  Cardiology Nuclear Med Study  Victor Lucero is a 76 y.o. male     MRN : [id number]     DOB: 1935-06-15  Procedure Date: 04/14/2011  Nuclear Med Background Indication for Stress Test:  Evaluation for Ischemia History: '08 Heart Cath: Minimum CAD, EF:65%, '09 ECHO: mild LVH Cardiac Risk Factors: Family History - CAD, History of Smoking, Hypertension and Lipids  Symptoms:  Chest Pain, Chest Tightness with Exertion and Palpitations   Nuclear Pre-Procedure Caffeine/Decaff Intake:  None NPO After: 8:00pm   Lungs:  clear O2 Sat: 97% on room air. IV 0.9% NS with Angio Cath:  22g  IV Site: R Antecubital  IV Started by:  Burna Mortimer Deal, RT-N  Chest Size (in):  54 Cup Size: n/a  Height: 5\' 11"  (1.803 m)  Weight:  305 lb (138.347 kg)  BMI:  Body mass index is 42.54 kg/(m^2). Tech Comments:  Last dose of Metoprolol at 9:00pm 04/13/11. W.Deal,RT-N    Nuclear Med Study 1 or 2 day study: 2 day  Stress Test Type:  Eugenie Lucero  Reading MD: [name], MD  Order Authorizing Provider:  [name] P. Nahser,MD  Resting Radionuclide: Technetium 36m Tetrofosmin  Resting Radionuclide Dose: 32.0 mCi           On 04-18-11  Stress Radionuclide:  Technetium 35m Tetrofosmin  Stress Radionuclide Dose: 33.0 mCi             On 04-14-11                      Stress Protocol Rest HR: 57 Stress HR: 60  Rest BP: 148/81 Stress BP: 140/62  Exercise Time (min): n/a METS: n/a   Predicted Max HR: 147 bpm % Max HR: 40.82 bpm Rate Pressure Product: 8880   Dose of Adenosine (mg):  n/a Dose of Lexiscan: 0.4 mg  Dose of Atropine (mg): n/a Dose of Dobutamine: n/a mcg/kg/min (at max HR)  Stress Test Technologist: Milana Na, EMT-P  Nuclear Technologist:  Domenic Polite, CNMT     Rest Procedure:  Myocardial perfusion imaging was performed at rest 45 minutes following the intravenous administration of Technetium 72m  Tetrofosmin. Rest ECG: Sinus Bradycardia with PVCS  Stress Procedure:  The patient received IV Lexiscan 0.4 mg over 15-seconds.  Technetium 34m Tetrofosmin injected at 30-seconds.  There were no significant changes with Lexiscan.  Quantitative spect images were obtained after a 45 minute delay. Stress ECG: No significant change from baseline ECG and No significant ST segment change suggestive of ischemia.  QPS Raw Data Images:  Acquisition technically good; LVE. Stress Images:  Normal homogeneous uptake in all areas of the myocardium. Rest Images:  Normal homogeneous uptake in all areas of the myocardium. Subtraction (SDS):  No evidence of ischemia. Transient Ischemic Dilatation (Normal <1.22):  1.17 Lung/Heart Ratio (Normal <0.45):  0.37  Quantitative Gated Spect Images QGS EDV:  NA QGS ESV:  NA  Impression Exercise Capacity:  Lexiscan with no exercise. BP Response:  Normal blood pressure response. Clinical Symptoms:  There is dyspnea and chest pressure. ECG Impression:  No significant ST segment change suggestive of ischemia. Comparison with Prior Nuclear Study: No images to compare  Overall Impression:  Normal stress nuclear study.  LV Ejection Fraction: Study not gated.  LV Wall Motion:  Study not gated due to ectopy.   Victor Lucero

## 2011-04-18 ENCOUNTER — Ambulatory Visit (HOSPITAL_COMMUNITY): Payer: Medicare Other | Attending: Cardiology | Admitting: Radiology

## 2011-04-18 DIAGNOSIS — R0989 Other specified symptoms and signs involving the circulatory and respiratory systems: Secondary | ICD-10-CM

## 2011-04-18 MED ORDER — TECHNETIUM TC 99M TETROFOSMIN IV KIT
32.0000 | PACK | Freq: Once | INTRAVENOUS | Status: AC | PRN
  Administered 2011-04-18: 32 via INTRAVENOUS

## 2011-04-26 ENCOUNTER — Telehealth: Payer: Self-pay | Admitting: Cardiovascular Disease

## 2011-04-26 NOTE — Telephone Encounter (Signed)
Intolerant of metoprolol, dont want to take anymore, states he falls asleep when sits down then his sleep apnea wakes him up, SOB, CP continues, BP 171/87 p 63, Dr Elease Hashimoto wants him seen, placed on schedule in morning.

## 2011-04-26 NOTE — Telephone Encounter (Signed)
Patient request return call at [phone]  Patient thinks he may be having side affects from BP meds; very sleepy, fatigue. He can be reached at [phone].

## 2011-04-27 ENCOUNTER — Ambulatory Visit (HOSPITAL_COMMUNITY): Payer: Medicare Other | Attending: Cardiovascular Disease

## 2011-04-27 ENCOUNTER — Ambulatory Visit (INDEPENDENT_AMBULATORY_CARE_PROVIDER_SITE_OTHER): Payer: Medicare Other | Admitting: Cardiovascular Disease

## 2011-04-27 ENCOUNTER — Encounter: Payer: Self-pay | Admitting: Cardiovascular Disease

## 2011-04-27 ENCOUNTER — Other Ambulatory Visit: Payer: Self-pay

## 2011-04-27 VITALS — BP 150/82 | HR 60 | Ht 71.0 in | Wt 309.4 lb

## 2011-04-27 DIAGNOSIS — G4730 Sleep apnea, unspecified: Secondary | ICD-10-CM

## 2011-04-27 DIAGNOSIS — E785 Hyperlipidemia, unspecified: Secondary | ICD-10-CM

## 2011-04-27 DIAGNOSIS — I10 Essential (primary) hypertension: Secondary | ICD-10-CM

## 2011-04-27 DIAGNOSIS — R079 Chest pain, unspecified: Secondary | ICD-10-CM

## 2011-04-27 DIAGNOSIS — E039 Hypothyroidism, unspecified: Secondary | ICD-10-CM

## 2011-04-27 DIAGNOSIS — R0602 Shortness of breath: Secondary | ICD-10-CM

## 2011-04-27 MED ORDER — NEBIVOLOL HCL 10 MG PO TABS: 10.0000 mg | ORAL_TABLET | Freq: Every day | ORAL | Status: DC

## 2011-04-27 NOTE — Assessment & Plan Note (Signed)
He's not had any recurrent episodes of chest pain. His stress Myoview recently was negative for ischemia.

## 2011-04-27 NOTE — Patient Instructions (Signed)
Your physician has recommended you make the following change in your medication: Stop your metoprolol and start Bystolic 10mg  daily  Your physician has requested that you have an echocardiogram. Echocardiography is a painless test that uses sound waves to create images of your heart. It provides your doctor with information about the size and shape of your heart and how well your heart's chambers and valves are working. This procedure takes approximately one hour. There are no restrictions for this procedure.  Your physician recommends that you schedule a follow-up appointment in: 3 months.

## 2011-04-27 NOTE — Progress Notes (Addendum)
Victor Lucero Date of Birth  04/29/1935 [facility] [address]    [address] [place], [place]  [address] [phone]  Fax  [phone]  Problem List: 1. Hypertension 2. Hypothyroidism 3. Hyperlipidemia 4. Obesity 5. Sleep apnea-currently on CPAP 6. History of  chest pain-normal cardiac catheterization in 2008. Normal coronary arteries with normal left ventricle systolic function-recent stress Myoview study revealed no evidence of ischemia. We cannot determine the left ventricular systolic function because the study was not able to be gated.   History of Present Illness:  By the is a 76 year old gentleman with a history of hypertension, hypothyroidism, hyperlipidemia, sleep apnea, and a recent hip and knee replacement.   Has not gotten any good sleep for the past several years. Because of the insomnia and sleep apnea, he has not had a restful night in years.  He sees Dr. Benjaman Kindler for evaluation of his sleep apnea. He uses the nasal pillows. He does not see any benefit from the CPAP device.  He becomes very dyspneic with minimal exertion. Coming in to the office today his O2 saturations were 97%. With ambulation his sats dropped to 88%. They then returned up to 96% after he stopped and rested for a while.  He's gained 20 pounds over the past 6 months.   Has frequent episodes of shortness breath. He thinks that it's all because he is not in good shape.   Current Outpatient Prescriptions on File Prior to Visit  Medication Sig Dispense Refill  . aspirin 81 MG tablet Take 81 mg by mouth daily.        Marland Kitchen azelastine (ASTELIN) 137 MCG/SPRAY nasal spray Place 2 sprays into the nose 2 (two) times daily. Use in each nostril as directed      . diphenhydrAMINE (BENADRYL) 50 MG capsule Take 50 mg by mouth daily.       . fluticasone (FLONASE) 50 MCG/ACT nasal spray Place 2 sprays into the nose daily.      Marland Kitchen latanoprost (XALATAN) 0.005 % ophthalmic solution Place 1 drop into both eyes at  bedtime.        Marland Kitchen levothyroxine (SYNTHROID, LEVOTHROID) 150 MCG tablet Take 150 mcg by mouth daily.        Marland Kitchen losartan (COZAAR) 100 MG tablet Take 1 tablet (100 mg total) by mouth daily.  30 tablet  5  . metoprolol tartrate (LOPRESSOR) 25 MG tablet Take 1 tablet (25 mg total) by mouth 2 (two) times daily.  60 tablet  11  . omeprazole (PRILOSEC OTC) 20 MG tablet Take 20 mg by mouth 2 (two) times daily.       Marland Kitchen DISCONTD: zolpidem (AMBIEN) 10 MG tablet Take 10 mg by mouth at bedtime as needed.          Allergies  Allergen Reactions  . Penicillins Rash    Past Medical History  Diagnosis Date  . Hypertension   . Hypothyroidism   . Hyperlipemia   . Sleep apnea     CPAP  . S/P TKR (total knee replacement)     R KNEE  . Dysphasia   . PVC (premature ventricular contraction)   . S/P cardiac catheterization 2008    with minimal irregularities and a normal EF  . Insomnia     Past Surgical History  Procedure Date  . Total knee arthroplasty 04/08/08    R KNEE  . Cardiac catheterization 08/07/06  . Thyroidectomy 1970'S  . Appendectomy 2003  . Bowel  CANCER REMOVAL  . Tonsillectomy and adenoidectomy     AGE 31    History  Smoking status  . Former Smoker -- 2.0 packs/day for 10 years  . Types: Cigarettes  . Quit date: 01/11/1975  Smokeless tobacco  . Not on file    History  Alcohol Use  . Yes    WINE/BEER OCCASIONALLY    Family History  Problem Relation Age of Onset  . Cancer Father     THYROID CA-DECEASED  . Stroke Father     DECEASED  . Heart attack Mother     DECEASED  . Hypertension Mother     DECEASED  . Parkinsonism Brother     DECEASED  . Heart attack Brother     DECEASED  . Heart attack Sister     DECEASED  . Hypertension Sister   . Cancer Sister     BRAIN TUMOR-DECEASE  . Hypertension Sister     DECEASED  . Cancer Sister     PANCREAS-DECEASED  . Hypertension Sister     Reviw of Systems:  Reviewed in the HPI.  All other systems are  negative.  Physical Exam: BP 150/82  Pulse 60  Ht 5\' 11"  (1.803 m)  Wt 309 lb 6.4 oz (140.343 kg)  BMI 43.15 kg/m2  SpO2 87% The patient is alert and oriented x 3.  The mood and affect are normal.   Skin: warm and dry.  Color is normal.    HEENT:   Normal carotids. No JVD.  Lungs: Lungs are clear.   Heart: Regular rate S1-S2.    Abdomen: Nontender. He has good bowel sounds  Extremities:  No clubbing cyanosis or edema.  Neuro:  Nonfocal.       ECG: Normal sinus rhythm with frequent premature beats in a bigeminal pattern. These are likely to be premature ventricular contractions although I cannot exclude premature atrial contractions.  Assessment / Plan:

## 2011-04-27 NOTE — Assessment & Plan Note (Addendum)
Victor Lucero Presents with persistent hypertension. He clearly is not tolerating the metoprolol. He's having lots of generalized fatigue and somnolence. I suspect that some of this may be due to his sleep apnea. He is diagnosed with apnea and states that his nasal pillows did not help him at night.  We will stop the metoprolol and start him on Bystolic 10 mg a day.  It is clear that he also eats excessive salt. He goes out to eat almost every meal. I've encouraged him to try to learn to cook as a method of eating a  lower salt diet.  I think a lot of this also may be due to his obesity. I've encouraged him to lose 5-10 pounds for seen again in 3 months.

## 2011-04-27 NOTE — Assessment & Plan Note (Signed)
He has  sleep apnea and clearly states that the CPAP apparatus is not performed normally. I'm worried that he may have pulmonary hypertension. We'll get an echocardiogram for further evaluation of his left ventricular systolic function and to evaluate his pulmonary pressures.

## 2011-05-09 ENCOUNTER — Ambulatory Visit: Payer: Medicare Other | Admitting: Cardiovascular Disease

## 2011-07-26 ENCOUNTER — Ambulatory Visit: Payer: Medicare Other | Admitting: Cardiovascular Disease

## 2011-07-27 ENCOUNTER — Ambulatory Visit: Payer: Medicare Other | Admitting: Cardiovascular Disease

## 2013-07-17 ENCOUNTER — Other Ambulatory Visit: Payer: Self-pay | Admitting: Neurosurgery

## 2013-07-17 ENCOUNTER — Other Ambulatory Visit: Payer: Self-pay | Admitting: Internal Medicine

## 2013-07-17 DIAGNOSIS — IMO0002 Reserved for concepts with insufficient information to code with codable children: Secondary | ICD-10-CM

## 2013-07-26 ENCOUNTER — Ambulatory Visit
Admission: RE | Admit: 2013-07-26 | Discharge: 2013-07-26 | Disposition: A | Discharge status: Home / Self Care | Payer: Medicare Other | Source: Ambulatory Visit | Attending: Neurosurgery | Admitting: Neurosurgery

## 2013-07-26 DIAGNOSIS — IMO0002 Reserved for concepts with insufficient information to code with codable children: Secondary | ICD-10-CM

## 2014-12-14 ENCOUNTER — Emergency Department (HOSPITAL_BASED_OUTPATIENT_CLINIC_OR_DEPARTMENT_OTHER)
Admission: EM | Admit: 2014-12-14 | Discharge: 2014-12-14 | Disposition: A | Discharge status: Home / Self Care | Payer: Medicare Other | Attending: Emergency Medicine | Admitting: Emergency Medicine

## 2014-12-14 ENCOUNTER — Encounter (HOSPITAL_BASED_OUTPATIENT_CLINIC_OR_DEPARTMENT_OTHER): Payer: Self-pay

## 2014-12-14 DIAGNOSIS — Z7901 Long term (current) use of anticoagulants: Secondary | ICD-10-CM | POA: Insufficient documentation

## 2014-12-14 DIAGNOSIS — Z85038 Personal history of other malignant neoplasm of large intestine: Secondary | ICD-10-CM | POA: Diagnosis not present

## 2014-12-14 DIAGNOSIS — K922 Gastrointestinal hemorrhage, unspecified: Secondary | ICD-10-CM | POA: Insufficient documentation

## 2014-12-14 DIAGNOSIS — Z9889 Other specified postprocedural states: Secondary | ICD-10-CM | POA: Insufficient documentation

## 2014-12-14 DIAGNOSIS — Z7951 Long term (current) use of inhaled steroids: Secondary | ICD-10-CM | POA: Insufficient documentation

## 2014-12-14 DIAGNOSIS — Z87891 Personal history of nicotine dependence: Secondary | ICD-10-CM | POA: Diagnosis not present

## 2014-12-14 DIAGNOSIS — Z8739 Personal history of other diseases of the musculoskeletal system and connective tissue: Secondary | ICD-10-CM | POA: Diagnosis not present

## 2014-12-14 DIAGNOSIS — Z79899 Other long term (current) drug therapy: Secondary | ICD-10-CM | POA: Diagnosis not present

## 2014-12-14 DIAGNOSIS — G4730 Sleep apnea, unspecified: Secondary | ICD-10-CM | POA: Diagnosis not present

## 2014-12-14 DIAGNOSIS — E039 Hypothyroidism, unspecified: Secondary | ICD-10-CM | POA: Diagnosis not present

## 2014-12-14 DIAGNOSIS — Z880 Allergy status to penicillin: Secondary | ICD-10-CM | POA: Diagnosis not present

## 2014-12-14 DIAGNOSIS — I10 Essential (primary) hypertension: Secondary | ICD-10-CM | POA: Diagnosis not present

## 2014-12-14 DIAGNOSIS — Z86711 Personal history of pulmonary embolism: Secondary | ICD-10-CM | POA: Diagnosis not present

## 2014-12-14 HISTORY — DX: Unspecified osteoarthritis, unspecified site: M19.90

## 2014-12-14 HISTORY — DX: Other pulmonary embolism without acute cor pulmonale: I26.99

## 2014-12-14 HISTORY — DX: Malignant (primary) neoplasm, unspecified: C80.1

## 2014-12-14 HISTORY — DX: Malignant neoplasm of colon, unspecified: C18.9

## 2014-12-14 LAB — CBC WITH DIFFERENTIAL/PLATELET
BASOS ABS: 0 10*3/uL (ref 0.0–0.1)
BASOS PCT: 0 %
EOS ABS: 0.1 10*3/uL (ref 0.0–0.7)
Eosinophils Relative: 2 %
HEMATOCRIT: 44.6 % (ref 39.0–52.0)
HEMOGLOBIN: 15.3 g/dL (ref 13.0–17.0)
Lymphocytes Relative: 27 %
Lymphs Abs: 1.8 10*3/uL (ref 0.7–4.0)
MCH: 32.3 pg (ref 26.0–34.0)
MCHC: 34.3 g/dL (ref 30.0–36.0)
MCV: 94.1 fL (ref 78.0–100.0)
Monocytes Absolute: 0.6 10*3/uL (ref 0.1–1.0)
Monocytes Relative: 9 %
NEUTROS ABS: 4.1 10*3/uL (ref 1.7–7.7)
NEUTROS PCT: 62 %
PLATELETS: 203 10*3/uL (ref 150–400)
RBC: 4.74 MIL/uL (ref 4.22–5.81)
RDW: 13.3 % (ref 11.5–15.5)
WBC: 6.7 10*3/uL (ref 4.0–10.5)

## 2014-12-14 LAB — PROTIME-INR
INR: 2.05 — ABNORMAL HIGH (ref 0.00–1.49)
Prothrombin Time: 23 seconds — ABNORMAL HIGH (ref 11.6–15.2)

## 2014-12-14 LAB — TROPONIN I: Troponin I: <0.03 ng/mL (ref <0.031)

## 2014-12-14 LAB — COMPREHENSIVE METABOLIC PANEL
ALBUMIN: 3.9 g/dL (ref 3.5–5.0)
ALK PHOS: 48 U/L (ref 38–126)
ALT: 18 U/L (ref 17–63)
ANION GAP: 8 (ref 5–15)
AST: 22 U/L (ref 15–41)
BILIRUBIN TOTAL: 0.8 mg/dL (ref 0.3–1.2)
BUN: 24 mg/dL — ABNORMAL HIGH (ref 6–20)
CALCIUM: 8.8 mg/dL — AB (ref 8.9–10.3)
CO2: 25 mmol/L (ref 22–32)
Chloride: 106 mmol/L (ref 101–111)
Creatinine, Ser: 1.09 mg/dL (ref 0.61–1.24)
GFR calc non Af Amer: >60 mL/min (ref >60)
GLUCOSE: 110 mg/dL — AB (ref 65–99)
POTASSIUM: 3.8 mmol/L (ref 3.5–5.1)
SODIUM: 139 mmol/L (ref 135–145)
TOTAL PROTEIN: 6.8 g/dL (ref 6.5–8.1)

## 2014-12-14 LAB — OCCULT BLOOD X 1 CARD TO LAB, STOOL: Fecal Occult Bld: POSITIVE — AB

## 2014-12-14 NOTE — ED Notes (Signed)
MD at bedside. 

## 2014-12-14 NOTE — Discharge Instructions (Signed)
Gastrointestinal Bleeding Do not take tonight's dose of Coumadin. Call your pulmonologist in the morning to see if you can temporarily discontinue your Coumadin. Call your family doctor tomorrow morning to arrange a recheck this week and to schedule a colonoscopy. Gastrointestinal (GI) bleeding means there is bleeding somewhere along the digestive tract, between the mouth and anus. CAUSES  There are many different problems that can cause GI bleeding. Possible causes include:  Esophagitis. This is inflammation, irritation, or swelling of the esophagus.  Hemorrhoids.These are veins that are full of blood (engorged) in the rectum. They cause pain, inflammation, and may bleed.  Anal fissures.These are areas of painful tearing which may bleed. They are often caused by passing hard stool.  Diverticulosis.These are pouches that form on the colon over time, with age, and may bleed significantly.  Diverticulitis.This is inflammation in areas with diverticulosis. It can cause pain, fever, and bloody stools, although bleeding is rare.  Polyps and cancer. Colon cancer often starts out as precancerous polyps.  Gastritis and ulcers.Bleeding from the upper gastrointestinal tract (near the stomach) may travel through the intestines and produce black, sometimes tarry, often bad smelling stools. In certain cases, if the bleeding is fast enough, the stools may not be black, but red. This condition may be life-threatening. SYMPTOMS   Vomiting bright red blood or material that looks like coffee grounds.  Bloody, black, or tarry stools. DIAGNOSIS  Your caregiver may diagnose your condition by taking your history and performing a physical exam. More tests may be needed, including:  X-rays and other imaging tests.  Esophagogastroduodenoscopy (EGD). This test uses a flexible, lighted tube to look at your esophagus, stomach, and small intestine.  Colonoscopy. This test uses a flexible, lighted tube to look  at your colon. TREATMENT  Treatment depends on the cause of your bleeding.   For bleeding from the esophagus, stomach, small intestine, or colon, the caregiver doing your EGD or colonoscopy may be able to stop the bleeding as part of the procedure.  Inflammation or infection of the colon can be treated with medicines.  Many rectal problems can be treated with creams, suppositories, or warm baths.  Surgery is sometimes needed.  Blood transfusions are sometimes needed if you have lost a lot of blood. If bleeding is slow, you may be allowed to go home. If there is a lot of bleeding, you will need to stay in the hospital for observation. HOME CARE INSTRUCTIONS   Take any medicines exactly as prescribed.  Keep your stools soft by eating foods that are high in fiber. These foods include whole grains, legumes, fruits, and vegetables. Prunes (1 to 3 a day) work well for many people.  Drink enough fluids to keep your urine clear or pale yellow. SEEK IMMEDIATE MEDICAL CARE IF:   Your bleeding increases.  You feel lightheaded, weak, or you faint.  You have severe cramps in your back or abdomen.  You pass large blood clots in your stool.  Your problems are getting worse. MAKE SURE YOU:   Understand these instructions.  Will watch your condition.  Will get help right away if you are not doing well or get worse.   This information is not intended to replace advice given to you by your health care provider. Make sure you discuss any questions you have with your health care provider.   Document Released: 12/25/1999 Document Revised: 12/14/2011 Document Reviewed: 06/16/2014 Elsevier Interactive Patient Education [employer].

## 2014-12-14 NOTE — ED Provider Notes (Signed)
CSN: [id number]     Arrival date & time 12/14/14  0831 History   First MD Initiated Contact with Patient 12/14/14 [phone]     Chief Complaint  Patient presents with  . GI Bleeding     (Consider location/radiation/quality/duration/timing/severity/associated sxs/prior Treatment) HPI Patient's had 3 episodes of rectal bleeding. Friday evening after stool he reports a large amount of red blood. This occurred again on Saturday. This morning he has again had red blood with stool. He describes some mild lower abdominal discomfort and cramping. Patient is on Coumadin for prior history of PE 2 years ago. He denies chest pain or shortness of breath. No syncopal episode. No lightheadedness. Past Medical History  Diagnosis Date  . Hypertension   . Hypothyroidism   . Hyperlipemia   . Sleep apnea     CPAP  . S/P TKR (total knee replacement)     R KNEE  . Dysphasia   . PVC (premature ventricular contraction)   . S/P cardiac catheterization 2008    with minimal irregularities and a normal EF  . Insomnia   . Arthritis   . Cancer ([place])   . Colon cancer ([place])   . Pulmonary embolism [facility])    Past Surgical History  Procedure Laterality Date  . Total knee arthroplasty  04/08/08    R KNEE  . Cardiac catheterization  08/07/06  . Thyroidectomy  1970'S  . Appendectomy  2003  . Bowel      CANCER REMOVAL  . Tonsillectomy and adenoidectomy      AGE 82   Family History  Problem Relation Age of Onset  . Cancer Father     THYROID CA-DECEASED  . Stroke Father     DECEASED  . Heart attack Mother     DECEASED  . Hypertension Mother     DECEASED  . Parkinsonism Brother     DECEASED  . Heart attack Brother     DECEASED  . Heart attack Sister     DECEASED  . Hypertension Sister   . Cancer Sister     BRAIN TUMOR-DECEASE  . Hypertension Sister     DECEASED  . Cancer Sister     PANCREAS-DECEASED  . Hypertension Sister    Social History  Substance Use Topics  . Smoking status: Former Smoker  -- 2.00 packs/day for 10 years    Types: Cigarettes    Quit date: 01/11/1975  . Smokeless tobacco: None  . Alcohol Use: Yes     Comment: WINE/BEER OCCASIONALLY    Review of Systems 10 Systems reviewed and are negative for acute change except as noted in the HPI.   Allergies  Penicillins  Home Medications   Prior to Admission medications   Medication Sig Start Date End Date Taking? Authorizing Provider  warfarin (COUMADIN) 2.5 MG tablet Take 2.5 mg by mouth daily.   Yes Historical Provider, MD  azelastine (ASTELIN) 137 MCG/SPRAY nasal spray Place 2 sprays into the nose 2 (two) times daily. Use in each nostril as directed    Historical Provider, MD  diphenhydrAMINE (BENADRYL) 50 MG capsule Take 50 mg by mouth daily.     Historical Provider, MD  fluticasone (FLONASE) 50 MCG/ACT nasal spray Place 2 sprays into the nose daily.    Historical Provider, MD  latanoprost (XALATAN) 0.005 % ophthalmic solution Place 1 drop into both eyes at bedtime.      Historical Provider, MD  levothyroxine (SYNTHROID, LEVOTHROID) 150 MCG tablet Take 150 mcg by mouth daily.  Historical Provider, MD  losartan (COZAAR) 100 MG tablet Take 1 tablet (100 mg total) by mouth daily. 11/02/10 11/02/11  Thayer Headings, MD  oxymetazoline (AFRIN) 0.05 % nasal spray Place 1 spray into the nose at bedtime.    Historical Provider, MD   BP 130/69 mmHg  Pulse 69  Temp(Src) 97.6 F (36.4 C) (Oral)  Resp 20  Ht 5\' 11"  (1.803 m)  Wt 325 lb (147.419 kg)  BMI 45.35 kg/m2  SpO2 98% Physical Exam  Constitutional: He is oriented to person, place, and time. He appears well-developed and well-nourished.  Moderately obese male nontoxic and alert. No respiratory distress. Color is good.  HENT:  Head: Normocephalic and atraumatic.  Eyes: EOM are normal.  Neck: Neck supple.  Cardiovascular: Normal rate, regular rhythm, normal heart sounds and intact distal pulses.   Pulmonary/Chest: Effort normal and breath sounds normal.   Abdominal: Soft. Bowel sounds are normal. He exhibits no distension. There is no tenderness.  Genitourinary:  Rectal exam thin brownish slightly pink tinged stool in the vault.  Musculoskeletal: Normal range of motion. He exhibits no edema or tenderness.  Neurological: He is alert and oriented to person, place, and time. He has normal strength. He exhibits normal muscle tone. Coordination normal. GCS eye subscore is 4. GCS verbal subscore is 5. GCS motor subscore is 6.  Skin: Skin is warm, dry and intact.  Psychiatric: He has a normal mood and affect.    ED Course  Procedures (including critical care time) Labs Review Labs Reviewed  OCCULT BLOOD X 1 CARD TO LAB, STOOL - Abnormal; Notable for the following:    Fecal Occult Bld POSITIVE (*)    All other components within normal limits  COMPREHENSIVE METABOLIC PANEL - Abnormal; Notable for the following:    Glucose, Bld 110 (*)    BUN 24 (*)    Calcium 8.8 (*)    All other components within normal limits  PROTIME-INR - Abnormal; Notable for the following:    Prothrombin Time 23.0 (*)    INR 2.05 (*)    All other components within normal limits  TROPONIN I  CBC WITH DIFFERENTIAL/PLATELET    Imaging Review No results found. I have personally reviewed and evaluated these images and lab results as part of my medical decision-making.   EKG Interpretation   Date/Time:  Sunday December 14 2014 08:53:32 EST Ventricular Rate:  69 PR Interval:  210 QRS Duration: 93 QT Interval:  411 QTC Calculation: 440 R Axis:   -35 Text Interpretation:  Sinus rhythm Left axis deviation Low voltage,  precordial leads Abnormal R-wave progression, late transition no change.  no acute ischemia Confirmed by Johnney Killian, MD, Jeannie Done [phone]) on 12/14/2014  9:56:06 AM      MDM   Final diagnoses:  Lower GI bleed   Patient describes several episodes of red bleeding with bowel movement. At this time vital signs and hemoglobin are stable. Patient's rectal  exam does not show melena. Patient is on Coumadin and his INR is at 2. He advises he must be home to care for his ailing wife. At this point, with INR not elevated, hemoglobin of 15, stable vital signs and no melena, I do feel the patient can be discharged at this time with close follow-up. He is to hold his evening Coumadin dose and call his pulmonologist in the morning to determine if he could temporarily discontinue Coumadin use for recurrent PE. He is also to call his primary care provider tomorrow morning  to schedule endoscopy ASAP. He is counseled on signs and symptoms for which to return.    Charlesetta Shanks, MD 12/14/14 1005

## 2014-12-14 NOTE — ED Notes (Signed)
Patient here with 3 episodes of frank blood mixed with stool since Friday evening. Denies abdominal pain, patient concerned because he is on coumadin for previous PE. hasnt had leveled checked for 6 weeks

## 2015-10-19 ENCOUNTER — Encounter: Payer: Self-pay | Admitting: Physical Therapy

## 2015-10-19 ENCOUNTER — Ambulatory Visit: Payer: Medicare Other | Attending: Unknown Physician Specialty | Admitting: Physical Therapy

## 2015-10-19 DIAGNOSIS — R262 Difficulty in walking, not elsewhere classified: Secondary | ICD-10-CM | POA: Diagnosis present

## 2015-10-19 DIAGNOSIS — M545 Low back pain, unspecified: Secondary | ICD-10-CM

## 2015-10-19 DIAGNOSIS — G8929 Other chronic pain: Secondary | ICD-10-CM | POA: Insufficient documentation

## 2015-10-19 NOTE — Therapy (Signed)
[facility] [place], [place], [address] Phone: [phone]   Fax:  [phone]  Physical Therapy Evaluation  Patient Details  Name: Victor Lucero MRN: [id number] Date of Birth: 26-Jul-1935 Referring Provider: Tonia Ghent  Encounter Date: 10/19/2015      PT End of Session - 10/19/15 1342    Visit Number 1   Date for PT Re-Evaluation 12/19/15   PT Start Time 1303   PT Stop Time 1400   PT Time Calculation (min) 57 min   Activity Tolerance Patient tolerated treatment well   Behavior During Therapy [facility] for tasks assessed/performed      Past Medical History:  Diagnosis Date  . Arthritis   . Cancer ([place])   . Colon cancer ([place])   . Dysphasia   . Hyperlipemia   . Hypertension   . Hypothyroidism   . Insomnia   . Pulmonary embolism ([place])   . PVC (premature ventricular contraction)   . S/P cardiac catheterization 2008   with minimal irregularities and a normal EF  . S/P TKR (total knee replacement)    R KNEE  . Sleep apnea    CPAP    Past Surgical History:  Procedure Laterality Date  . APPENDECTOMY  2003  . BOWEL     CANCER REMOVAL  . CARDIAC CATHETERIZATION  08/07/06  . THYROIDECTOMY  1970'S  . TONSILLECTOMY AND ADENOIDECTOMY     AGE 80  . TOTAL KNEE ARTHROPLASTY  04/08/08   R KNEE    There were no vitals filed for this visit.       Subjective Assessment - 10/19/15 1306    Subjective Patient reports that he has nerve damage both legs that area affecting his balance.  LBP is from severe arthritis.  He reports very close to falling numerous times over the past year, had PT about a year ago that did not help.  Had a recent NCV test that shows nerve damage bilateral LE's., He does report low back pain   Limitations Standing;Walking;House hold activities   Patient Stated Goals walk better, have less pain   Currently in Pain? Yes   Pain Score 3    Pain Location Back   Pain Orientation Lower   Pain  Descriptors / Indicators Aching   Pain Type Chronic pain   Pain Onset More than a month ago   Pain Frequency Constant   Aggravating Factors  walking and standign, shopping pain up to 9/10   Pain Relieving Factors sit down will decrease to 3-4/10   Effect of Pain on Daily Activities can't stand or walk much            [facility] PT Assessment - 10/19/15 0001      Assessment   Medical Diagnosis LBP, difficulty walking   Referring Provider Mieden   Prior Therapy over a year ago     Precautions   Precautions None     Balance Screen   Has the patient fallen in the past 6 months No   Has the patient had a decrease in activity level because of a fear of falling?  Yes   Is the patient reluctant to leave their home because of a fear of falling?  No     Home Environment   Additional Comments does some shopping, no stairs     Prior Function   Level of Independence Independent with household mobility with device   Vocation Retired   Leisure no exercise  Posture/Postural Control   Posture Comments very stooped posture      ROM / Strength   AROM / PROM / Strength AROM;Strength     AROM   Overall AROM Comments Lumbar ROM decreased 75% with pain, he does have some decreased knee extension     Strength   Overall Strength Comments LE's 4/5     Flexibility   Soft Tissue Assessment /Muscle Length --  tightness in teh LE mms     Palpation   Palpation comment tightness in the lumbar area, mild tenderness in the L4-5 area     Ambulation/Gait   Gait Comments ambulates with a SPC, slow, significant forward flexed trunk, very short of breath.     Standardized Balance Assessment   Standardized Balance Assessment Timed Up and Go Test     Timed Up and Go Test   Normal TUG (seconds) 22                   [facility] Adult PT Treatment/Exercise - 10/19/15 0001      Modalities   Modalities Electrical Stimulation;Moist Heat     Moist Heat Therapy   Number Minutes Moist Heat 15  Minutes   Moist Heat Location Lumbar Spine     Electrical Stimulation   Electrical Stimulation Location Lumbar spine   Electrical Stimulation Action IFC   Electrical Stimulation Parameters sitting   Electrical Stimulation Goals Pain                PT Education - 10/19/15 1341    Education provided Yes   Education Details Wms Flexion exercises   Person(s) Educated Patient   Methods Explanation;Demonstration;Handout   Comprehension Verbalized understanding          PT Short Term Goals - 10/19/15 1353      PT SHORT TERM GOAL #1   Title independent with HEP   Time 2   Period Weeks   Status New           PT Long Term Goals - 10/19/15 1354      PT LONG TERM GOAL #1   Title Decrease TUG time to 18 seconds   Time 8   Period Weeks   Status New     PT LONG TERM GOAL #2   Title decrease LBP 25%   Time 8   Period Weeks   Status New     PT LONG TERM GOAL #3   Title walk 300 feet without rest   Time 8   Period Weeks   Status New     PT LONG TERM GOAL #4   Title report 25% less stumbles   Time 8   Period Weeks   Status New               Plan - 10/19/15 1343    Clinical Impression Statement Patient with a long history of LBP, also has neuropathy of the LE's.  He has a history of bilateral THR's and TKR's, he has bilateral RC repairs and may not be able to do any overhead activity.  His TUG time was 23 seconds,  We should do a Berg balance test on him in the near future.  He has had PT in the past for balance and reports that it did not help.  He had pretty good strength in the LE's , he is very short of breath with walking and cannot straighten up   Rehab Potential Good   PT Frequency 2x /  week   PT Duration 8 weeks   PT Treatment/Interventions ADLs/Self Care Home Management;Electrical Stimulation;Moist Heat;Gait training;Stair training;Functional mobility training;Patient/family education;Balance training;Therapeutic exercise;Therapeutic  activities;Manual techniques   PT Next Visit Plan may perform Berg, see if the estim helped pain   Consulted and Agree with Plan of Care Patient      Patient will benefit from skilled therapeutic intervention in order to improve the following deficits and impairments:  Abnormal gait, Cardiopulmonary status limiting activity, Decreased activity tolerance, Decreased balance, Decreased mobility, Decreased endurance, Decreased coordination, Decreased range of motion, Decreased strength, Difficulty walking, Increased muscle spasms, Impaired flexibility, Postural dysfunction, Improper body mechanics, Pain  Visit Diagnosis: Chronic midline low back pain without sciatica - Plan: PT plan of care cert/re-cert  Difficulty in walking, not elsewhere classified - Plan: PT plan of care cert/re-cert      G-Codes - [id number]    Functional Assessment Tool Used Foto 74% limitation   Functional Limitation Mobility: Walking and moving around   Mobility: Walking and Moving Around Current Status [phone]) At least 60 percent but less than 80 percent impaired, limited or restricted   Mobility: Walking and Moving Around Goal Status [phone]) At least 60 percent but less than 80 percent impaired, limited or restricted       Problem List Patient Active Problem List   Diagnosis Date Noted  . Chest pain 03/29/2011  . Hypertension   . Hypothyroidism   . Hyperlipemia   . Sleep apnea   . S/P TKR (total knee replacement)   . Dysphasia   . PVC (premature ventricular contraction)     Sumner Boast., PT 10/19/2015, 2:18 PM  [place], [place], [address] Phone: [phone]   Fax:  [phone]  Name: Victor Lucero MRN: [id number] Date of Birth: [birth year]

## 2015-10-26 ENCOUNTER — Ambulatory Visit: Payer: Medicare Other | Admitting: Physical Therapy

## 2015-10-26 ENCOUNTER — Encounter: Payer: Self-pay | Admitting: Physical Therapy

## 2015-10-26 DIAGNOSIS — M545 Low back pain, unspecified: Secondary | ICD-10-CM

## 2015-10-26 DIAGNOSIS — G8929 Other chronic pain: Secondary | ICD-10-CM

## 2015-10-26 DIAGNOSIS — R262 Difficulty in walking, not elsewhere classified: Secondary | ICD-10-CM

## 2015-10-26 NOTE — Therapy (Addendum)
[facility], [place], [address] Phone: [phone]   Fax:  [phone]  Physical Therapy Treatment  Patient Details  Name: Victor Lucero MRN: [id number] Date of Birth: [birth year] Referring Provider: Tonia Ghent  Encounter Date: 10/26/2015      PT End of Session - 10/26/15 1421    Visit Number 2   Date for PT Re-Evaluation 12/19/15   PT Start Time 1341   PT Stop Time 1435   PT Time Calculation (min) 54 min   Activity Tolerance Patient tolerated treatment well   Behavior During Therapy [facility] for tasks assessed/performed      Past Medical History:  Diagnosis Date  . Arthritis   . Cancer ([place])   . Colon cancer ([place])   . Dysphasia   . Hyperlipemia   . Hypertension   . Hypothyroidism   . Insomnia   . Pulmonary embolism ([place])   . PVC (premature ventricular contraction)   . S/P cardiac catheterization 2008   with minimal irregularities and a normal EF  . S/P TKR (total knee replacement)    R KNEE  . Sleep apnea    CPAP    Past Surgical History:  Procedure Laterality Date  . APPENDECTOMY  2003  . BOWEL     CANCER REMOVAL  . CARDIAC CATHETERIZATION  08/07/06  . THYROIDECTOMY  1970'S  . TONSILLECTOMY AND ADENOIDECTOMY     AGE 34  . TOTAL KNEE ARTHROPLASTY  04/08/08   R KNEE    There were no vitals filed for this visit.      Subjective Assessment - 10/26/15 1341    Subjective "About the same" Pt reports that he can not do his HEP "With all these artificial joints my body don't move like that"   Pain Score 7    Pain Location Back   Pain Orientation Lower                         [facility] Adult PT Treatment/Exercise - 10/26/15 0001      Exercises   Exercises Lumbar     Lumbar Exercises: Aerobic   Stationary Bike NuStep L4 x6 min     Lumbar Exercises: Seated   Long Arc Quad on Chair Both;2 sets;10 reps   Other Seated Lumbar Exercises Tband rows black 2x15; Horizontal  shoulder abd blue tband 2x10   Other Seated Lumbar Exercises back ext blaxk Tband 2x10; seated march 2x10     Modalities   Modalities Electrical Stimulation;Moist Heat     Moist Heat Therapy   Number Minutes Moist Heat 15 Minutes   Moist Heat Location Lumbar Spine     Electrical Stimulation   Electrical Stimulation Location Lumbar spine   Electrical Stimulation Action IFC   Electrical Stimulation Parameters sitting   Electrical Stimulation Goals Pain                  PT Short Term Goals - 10/19/15 1353      PT SHORT TERM GOAL #1   Title independent with HEP   Time 2   Period Weeks   Status New           PT Long Term Goals - 10/19/15 1354      PT LONG TERM GOAL #1   Title Decrease TUG time to 18 seconds   Time 8   Period Weeks   Status New     PT LONG  TERM GOAL #2   Title decrease LBP 25%   Time 8   Period Weeks   Status New     PT LONG TERM GOAL #3   Title walk 300 feet without rest   Time 8   Period Weeks   Status New     PT LONG TERM GOAL #4   Title report 25% less stumbles   Time 8   Period Weeks   Status New               Plan - 10/26/15 1421    Clinical Impression Statement Pt demos a decrease activity tolerance, frequent episodes of shortness of breath during today's session. Pt reports no c/o increase pain with today's interventions. Pt does reports a pulling sensation in R upper trap area with horizontal shoulder abduction   Rehab Potential Good   PT Frequency 2x / week   PT Duration 8 weeks   PT Treatment/Interventions ADLs/Self Care Home Management;Electrical Stimulation;Moist Heat;Gait training;Stair training;Functional mobility training;Patient/family education;Balance training;Therapeutic exercise;Therapeutic activities;Manual techniques   PT Next Visit Plan may perform Merrilee Jansky, continue e-stim      Patient will benefit from skilled therapeutic intervention in order to improve the following deficits and impairments:   Abnormal gait, Cardiopulmonary status limiting activity, Decreased activity tolerance, Decreased balance, Decreased mobility, Decreased endurance, Decreased coordination, Decreased range of motion, Decreased strength, Difficulty walking, Increased muscle spasms, Impaired flexibility, Postural dysfunction, Improper body mechanics, Pain  Visit Diagnosis: Chronic midline low back pain without sciatica  Difficulty in walking, not elsewhere classified     Problem List Patient Active Problem List   Diagnosis Date Noted  . Chest pain 03/29/2011  . Hypertension   . Hypothyroidism   . Hyperlipemia   . Sleep apnea   . S/P TKR (total knee replacement)   . Dysphasia   . PVC (premature ventricular contraction)     PHYSICAL THERAPY DISCHARGE SUMMARY  Visits from Start of Care: 2 Plan: Patient agrees to discharge.  Patient goals were not met. Patient is being discharged due to not returning since the last visit.  ?????     Scot Jun 10/26/2015, 2:26 PM  [place], [place], [address] Phone: [phone]   Fax:  [phone]  Name: Victor Lucero MRN: [id number] Date of Birth: 1936/01/08

## 2015-11-16 ENCOUNTER — Encounter: Payer: Self-pay | Admitting: Podiatry

## 2015-11-16 ENCOUNTER — Ambulatory Visit (INDEPENDENT_AMBULATORY_CARE_PROVIDER_SITE_OTHER): Payer: Medicare Other | Admitting: Podiatry

## 2015-11-16 VITALS — Ht 71.0 in | Wt 350.0 lb

## 2015-11-16 DIAGNOSIS — M2042 Other hammer toe(s) (acquired), left foot: Secondary | ICD-10-CM | POA: Diagnosis not present

## 2015-11-16 DIAGNOSIS — M79672 Pain in left foot: Secondary | ICD-10-CM

## 2015-11-16 DIAGNOSIS — M79671 Pain in right foot: Secondary | ICD-10-CM | POA: Diagnosis not present

## 2015-11-16 DIAGNOSIS — B351 Tinea unguium: Secondary | ICD-10-CM

## 2015-11-16 NOTE — Patient Instructions (Signed)
Seen for hypertrophic nails. All nails debrided. Return in 3 months or as needed.  

## 2015-11-16 NOTE — Progress Notes (Signed)
SUBJECTIVE: 80 y.o. year old male presents complaining of painful nails from thick and over growth and ingrown nails on both great toes. Also have pain from corn on the tip of left 2nd toe.   REVIEW OF SYSTEMS: Pertinent items noted in HPI and remainder of comprehensive ROS otherwise negative. Positive for bilateral knee problem and have numbness on both feet.  OBJECTIVE: DERMATOLOGIC EXAMINATION: Thick yellow and dystrophic nails x 10. Digital corn 2nd toe left at distal end with pain.  VASCULAR EXAMINATION OF LOWER LIMBS: Pedal pulses: All pedal pulses are palpable with normal pulsation.  Capillary Filling times within 3 seconds in all digits.  No edema or erythema noted. Temperature gradient from tibial crest to dorsum of foot is within normal bilateral.  NEUROLOGIC EXAMINATION OF THE LOWER LIMBS: Failed to respond Monofilament (Semmes-Weinstein 10-gm) sensory testing on both feet. Vibratory sensations(128Hz  turning fork) intact at medial and lateral forefoot bilateral.  Sharp and Dull discriminatory sensations at the plantar ball of hallux is intact bilateral.   MUSCULOSKELETAL EXAMINATION: Positive for severely contracted 2nd digit at MPJ and PIPJ left foot.   ASSESSMENT: Onychomycosis x 10. Hammer toe deformity with digital corn at distal end, symptomatic left foot.  PLAN: Reviewed clinical findings and available treatment options. All nails and calluses debrided.

## 2016-02-16 ENCOUNTER — Ambulatory Visit (INDEPENDENT_AMBULATORY_CARE_PROVIDER_SITE_OTHER): Payer: Medicare Other | Admitting: Podiatry

## 2016-02-16 DIAGNOSIS — M79671 Pain in right foot: Secondary | ICD-10-CM

## 2016-02-16 DIAGNOSIS — B351 Tinea unguium: Secondary | ICD-10-CM | POA: Diagnosis not present

## 2016-02-16 DIAGNOSIS — Q828 Other specified congenital malformations of skin: Secondary | ICD-10-CM

## 2016-02-16 DIAGNOSIS — M2042 Other hammer toe(s) (acquired), left foot: Secondary | ICD-10-CM

## 2016-02-16 DIAGNOSIS — M79672 Pain in left foot: Secondary | ICD-10-CM

## 2016-02-16 NOTE — Patient Instructions (Signed)
Seen for hypertrophic nails. All nails debrided. Return in 3 months or as needed.  

## 2016-02-16 NOTE — Progress Notes (Signed)
SUBJECTIVE: 81 y.o. year old male presents complaining of painful nails on both feet with dry skin. He is not able to reach down to his feet and his wife is sick requiring an assisted care.   REVIEW OF SYSTEMS: Pertinent items noted in HPI and remainder of comprehensive ROS otherwise negative. Positive for bilateral knee problem and have numbness on both feet.  OBJECTIVE: DERMATOLOGIC EXAMINATION: Thick yellow and dystrophic nails x 10. Digital corn 2nd toe left at distal end with pain.  VASCULAR EXAMINATION OF LOWER LIMBS: Pedal pulses: All pedal pulses are palpable with normal pulsation.  Capillary Filling times within 3 seconds in all digits.  No edema or erythema noted. Temperature gradient from tibial crest to dorsum of foot is within normal bilateral.  NEUROLOGIC EXAMINATION OF THE LOWER LIMBS: Failed to respond Monofilament (Semmes-Weinstein 10-gm) sensory testing on both feet. Vibratory sensations(128Hz  turning fork) intact at medial and lateral forefoot bilateral.  Sharp and Dull discriminatory sensations at the plantar ball of hallux is intact bilateral.   MUSCULOSKELETAL EXAMINATION: Positive for severely contracted 2nd digit at MPJ and PIPJ left foot.   ASSESSMENT: Onychomycosis x 10. Hammer toe deformity with digital corn at distal end, symptomatic left foot.  PLAN: Reviewed clinical findings and available treatment options. Both feet soaked. All nails and calluses debrided.

## 2016-02-17 ENCOUNTER — Encounter: Payer: Self-pay | Admitting: Podiatry

## 2016-05-16 ENCOUNTER — Ambulatory Visit: Payer: Medicare Other | Admitting: Podiatry

## 2016-05-23 ENCOUNTER — Ambulatory Visit (INDEPENDENT_AMBULATORY_CARE_PROVIDER_SITE_OTHER): Payer: Medicare Other | Admitting: Podiatry

## 2016-05-23 DIAGNOSIS — M79671 Pain in right foot: Secondary | ICD-10-CM | POA: Diagnosis not present

## 2016-05-23 DIAGNOSIS — M2042 Other hammer toe(s) (acquired), left foot: Secondary | ICD-10-CM

## 2016-05-23 DIAGNOSIS — B351 Tinea unguium: Secondary | ICD-10-CM | POA: Diagnosis not present

## 2016-05-23 DIAGNOSIS — M79672 Pain in left foot: Secondary | ICD-10-CM | POA: Diagnosis not present

## 2016-05-23 NOTE — Patient Instructions (Signed)
Seen for hypertrophic nails. Soaked both feet. All nails debrided. Return in 3 months or as needed.

## 2016-05-23 NOTE — Progress Notes (Signed)
SUBJECTIVE: 81 y.o.year old malepresents complaining of painful nails on both feet with dry skin. He is not able to reach down to his feet and his wife is sick requiring an assisted care. Patient is carrying portable O2 tank.  REVIEW OF SYSTEMS: Pertinent items noted in HPI and remainder of comprehensive ROS otherwise negative. Positive for bilateral knee problem and have numbness on both feet.  OBJECTIVE: DERMATOLOGIC EXAMINATION: Thick yellow and dystrophic nails x 10. Digital corn 2nd toe left at distal end with pain.  VASCULAR EXAMINATION OF LOWER LIMBS: Pedal pulses: All pedal pulses are palpable with normal pulsation.  Capillary Filling times within 3 seconds in all digits.  Positive of bilateral pedal edema. Temperature gradient from tibial crest to dorsum of foot is within normal bilateral.  NEUROLOGIC EXAMINATION OF THE LOWER LIMBS: Failed to respond Monofilament (Semmes-Weinstein 10-gm) sensory testing on both feet. Vibratory sensations(128Hz  turning fork) intact at medial and lateral forefoot bilateral.  Sharp and Dull discriminatory sensations at the plantar ball of hallux is intact bilateral.   MUSCULOSKELETAL EXAMINATION: Positive for severely contracted 2nd digit at MPJ and PIPJ left foot.   ASSESSMENT: Onychomycosis x 10. Hammer toe deformity with digital corn at distal end, symptomatic left foot.  PLAN: Reviewed clinical findings and available treatment options. Both feet soaked. All nails and calluses debrided.-

## 2016-05-24 ENCOUNTER — Encounter: Payer: Self-pay | Admitting: Podiatry

## 2016-08-23 ENCOUNTER — Encounter: Payer: Self-pay | Admitting: Podiatry

## 2016-08-23 ENCOUNTER — Ambulatory Visit (INDEPENDENT_AMBULATORY_CARE_PROVIDER_SITE_OTHER): Payer: Medicare Other | Admitting: Podiatry

## 2016-08-23 DIAGNOSIS — M79671 Pain in right foot: Secondary | ICD-10-CM | POA: Diagnosis not present

## 2016-08-23 DIAGNOSIS — M79672 Pain in left foot: Secondary | ICD-10-CM

## 2016-08-23 DIAGNOSIS — M2042 Other hammer toe(s) (acquired), left foot: Secondary | ICD-10-CM

## 2016-08-23 DIAGNOSIS — B351 Tinea unguium: Secondary | ICD-10-CM | POA: Diagnosis not present

## 2016-08-23 NOTE — Progress Notes (Signed)
SUBJECTIVE: 81 y.o.year old malepresents complaining of painful nails on both feet with dry skin.  2nd toe left foot is very painful.  Positive for bilateral knee problem and have numbness on both feet.  OBJECTIVE: DERMATOLOGIC EXAMINATION: Thick yellow and dystrophic nails x 10. Digital corn 2nd toe left at distal end with pain.  VASCULAR EXAMINATION OF LOWER LIMBS: Pedal pulses: All pedal pulses are palpable with normal pulsation.  Capillary Filling times within 3 seconds in all digits.  Positive of bilateral pedal edema. Temperature gradient from tibial crest to dorsum of foot is within normal bilateral.  NEUROLOGIC EXAMINATION OF THE LOWER LIMBS: Failed to respond Monofilament (Semmes-Weinstein 10-gm) sensory testing on both feet. Vibratory sensations(128Hz  turning fork) intact at medial and lateral forefoot bilateral.  Sharp and Dull discriminatory sensations at the plantar ball of hallux is intact bilateral.   MUSCULOSKELETAL EXAMINATION: Positive for severely contracted 2nd digit at MPJ and PIPJ left foot.   ASSESSMENT: Onychomycosis x 10. Hammer toe deformity with digital corn at distal end, symptomatic left foot.  PLAN: Reviewed clinical findings and available treatment options. Both feet soaked. All nails and calluses debrided.-

## 2016-08-23 NOTE — Patient Instructions (Signed)
Seen for hypertrophic nails and ulcerating 2nd toe digital corn left foot. Both feet soaked. All lesions and nails debrided. Return in 6 weeks.

## 2016-09-22 ENCOUNTER — Encounter (HOSPITAL_BASED_OUTPATIENT_CLINIC_OR_DEPARTMENT_OTHER): Payer: Self-pay | Admitting: *Deleted

## 2016-09-22 ENCOUNTER — Emergency Department (HOSPITAL_BASED_OUTPATIENT_CLINIC_OR_DEPARTMENT_OTHER)
Admission: EM | Admit: 2016-09-22 | Discharge: 2016-09-22 | Disposition: A | Discharge status: Home / Self Care | Payer: Medicare Other | Attending: Emergency Medicine | Admitting: Emergency Medicine

## 2016-09-22 ENCOUNTER — Emergency Department (HOSPITAL_BASED_OUTPATIENT_CLINIC_OR_DEPARTMENT_OTHER): Payer: Medicare Other

## 2016-09-22 DIAGNOSIS — N132 Hydronephrosis with renal and ureteral calculous obstruction: Secondary | ICD-10-CM | POA: Insufficient documentation

## 2016-09-22 DIAGNOSIS — Z7901 Long term (current) use of anticoagulants: Secondary | ICD-10-CM | POA: Diagnosis not present

## 2016-09-22 DIAGNOSIS — Z85038 Personal history of other malignant neoplasm of large intestine: Secondary | ICD-10-CM | POA: Insufficient documentation

## 2016-09-22 DIAGNOSIS — E039 Hypothyroidism, unspecified: Secondary | ICD-10-CM | POA: Diagnosis not present

## 2016-09-22 DIAGNOSIS — F17200 Nicotine dependence, unspecified, uncomplicated: Secondary | ICD-10-CM | POA: Diagnosis not present

## 2016-09-22 DIAGNOSIS — Z79899 Other long term (current) drug therapy: Secondary | ICD-10-CM | POA: Diagnosis not present

## 2016-09-22 DIAGNOSIS — I10 Essential (primary) hypertension: Secondary | ICD-10-CM | POA: Diagnosis not present

## 2016-09-22 DIAGNOSIS — R1032 Left lower quadrant pain: Secondary | ICD-10-CM | POA: Diagnosis present

## 2016-09-22 LAB — URINALYSIS, ROUTINE W REFLEX MICROSCOPIC
Bilirubin Urine: NEGATIVE
GLUCOSE, UA: NEGATIVE mg/dL
Ketones, ur: NEGATIVE mg/dL
NITRITE: NEGATIVE
PH: 6 (ref 5.0–8.0)
PROTEIN: NEGATIVE mg/dL
Specific Gravity, Urine: 1.025 (ref 1.005–1.030)

## 2016-09-22 LAB — CBC WITH DIFFERENTIAL/PLATELET
BASOS ABS: 0 10*3/uL (ref 0.0–0.1)
Basophils Relative: 1 %
Eosinophils Absolute: 0.2 10*3/uL (ref 0.0–0.7)
Eosinophils Relative: 3 %
HEMATOCRIT: 43.6 % (ref 39.0–52.0)
HEMOGLOBIN: 15.4 g/dL (ref 13.0–17.0)
LYMPHS PCT: 29 %
Lymphs Abs: 1.9 10*3/uL (ref 0.7–4.0)
MCH: 33.7 pg (ref 26.0–34.0)
MCHC: 35.3 g/dL (ref 30.0–36.0)
MCV: 95.4 fL (ref 78.0–100.0)
MONOS PCT: 11 %
Monocytes Absolute: 0.7 10*3/uL (ref 0.1–1.0)
NEUTROS ABS: 3.7 10*3/uL (ref 1.7–7.7)
NEUTROS PCT: 56 %
Platelets: 184 10*3/uL (ref 150–400)
RBC: 4.57 MIL/uL (ref 4.22–5.81)
RDW: 13.9 % (ref 11.5–15.5)
WBC: 6.5 10*3/uL (ref 4.0–10.5)

## 2016-09-22 LAB — COMPREHENSIVE METABOLIC PANEL
ALBUMIN: 3.7 g/dL (ref 3.5–5.0)
ALK PHOS: 53 U/L (ref 38–126)
ALT: 34 U/L (ref 17–63)
AST: 41 U/L (ref 15–41)
Anion gap: 8 (ref 5–15)
BILIRUBIN TOTAL: 0.8 mg/dL (ref 0.3–1.2)
BUN: 17 mg/dL (ref 6–20)
CALCIUM: 8.9 mg/dL (ref 8.9–10.3)
CO2: 22 mmol/L (ref 22–32)
CREATININE: 1.03 mg/dL (ref 0.61–1.24)
Chloride: 112 mmol/L — ABNORMAL HIGH (ref 101–111)
GFR calc Af Amer: >60 mL/min (ref >60)
GLUCOSE: 122 mg/dL — AB (ref 65–99)
POTASSIUM: 3.7 mmol/L (ref 3.5–5.1)
Sodium: 142 mmol/L (ref 135–145)
TOTAL PROTEIN: 6.9 g/dL (ref 6.5–8.1)

## 2016-09-22 LAB — LIPASE, BLOOD: LIPASE: 38 U/L (ref 11–51)

## 2016-09-22 LAB — URINALYSIS, MICROSCOPIC (REFLEX)

## 2016-09-22 MED ORDER — KETOROLAC TROMETHAMINE 30 MG/ML IJ SOLN
30.0000 mg | Freq: Once | INTRAMUSCULAR | Status: AC
  Administered 2016-09-22: 30 mg via INTRAVENOUS
  Filled 2016-09-22: qty 1

## 2016-09-22 MED ORDER — ONDANSETRON HCL 4 MG PO TABS: 4.0000 mg | ORAL_TABLET | Freq: Four times a day (QID) | ORAL | 0 refills | Status: AC | PRN

## 2016-09-22 MED ORDER — ONDANSETRON HCL 4 MG/2ML IJ SOLN: 4.0000 mg | Freq: Once | INTRAMUSCULAR | Status: DC

## 2016-09-22 MED ORDER — FENTANYL CITRATE (PF) 100 MCG/2ML IJ SOLN: 100.0000 ug | Freq: Once | INTRAMUSCULAR | Status: DC

## 2016-09-22 MED ORDER — OXYCODONE HCL 5 MG PO TABS: 2.5000 mg | ORAL_TABLET | ORAL | 0 refills | Status: AC | PRN

## 2016-09-22 MED ORDER — SODIUM CHLORIDE 0.9 % IV SOLN
1000.0000 mL | Freq: Once | INTRAVENOUS | Status: AC
  Administered 2016-09-22: 1000 mL via INTRAVENOUS

## 2016-09-22 MED FILL — ONDANSETRON HCL 4 MG TABLET: 4 | 4 days supply | Qty: 10 | Fill #0

## 2016-09-22 MED FILL — oxyCODONE HCL 5 MG TABS: 5 | 2 days supply | Qty: 8 | Fill #0

## 2016-09-22 NOTE — ED Notes (Signed)
Patient transported to CT 

## 2016-09-22 NOTE — ED Notes (Signed)
ED Provider at bedside. 

## 2016-09-22 NOTE — ED Provider Notes (Signed)
[facility] DEPT [facility] Provider Note   CSN: [id number] Arrival date & time: 09/22/16  3546     History   Chief Complaint No chief complaint on file.   HPI Victor Lucero is a 81 y.o. male who presents emergency Department with chief complaint of left lower quadrant abdominal pain. He has a past medical history of colon cancer with bowel reconstruction, pulmonary embolism, coronary artery disease. Patient states that yesterday he had a few small sharp pains in his lower abdomen that seemed to resolve without any issue. This morning during breakfast he had sudden onset of severe left lower quadrant abdominal pain that radiates to his flank. He denies any nausea or vomiting. He has a history of previous episodes of diverticulitis. He states that this does not feel similar. He has a history of kidney stones but states it has been several years. He is unsure if this feels the same. He rates his pain at 8 or 9 out of 10. He denies any urinary symptoms, fevers, chills.  HPI  Past Medical History:  Diagnosis Date  . Arthritis   . Cancer ([place])   . Colon cancer ([place])   . Dysphasia   . Hyperlipemia   . Hypertension   . Hypothyroidism   . Insomnia   . Pulmonary embolism ([place])   . PVC (premature ventricular contraction)   . S/P cardiac catheterization 2008   with minimal irregularities and a normal EF  . S/P TKR (total knee replacement)    R KNEE  . Sleep apnea    CPAP    Patient Active Problem List   Diagnosis Date Noted  . Chest pain 03/29/2011  . Hypertension   . Hypothyroidism   . Hyperlipemia   . Sleep apnea   . S/P TKR (total knee replacement)   . Dysphasia   . PVC (premature ventricular contraction)     Past Surgical History:  Procedure Laterality Date  . APPENDECTOMY  2003  . BOWEL     CANCER REMOVAL  . CARDIAC CATHETERIZATION  08/07/06  . CARPAL TUNNEL RELEASE    . THYROIDECTOMY  1970'S  . TONSILLECTOMY AND ADENOIDECTOMY     AGE 89  . TOTAL HIP ARTHROPLASTY  Bilateral   . TOTAL KNEE ARTHROPLASTY  04/08/08   R KNEE       Home Medications    Prior to Admission medications   Medication Sig Start Date End Date Taking? Authorizing Provider  amLODipine (NORVASC) 5 MG tablet  09/03/15   [provider]  azelastine (ASTELIN) 137 MCG/SPRAY nasal spray Place 2 sprays into the nose 2 (two) times daily. Use in each nostril as directed    [provider]  cetirizine (ZYRTEC) 10 MG tablet Take 10 mg by mouth.    [provider]  diphenhydrAMINE (BENADRYL) 50 MG capsule Take 50 mg by mouth daily.     [provider]  finasteride (PROSCAR) 5 MG tablet TAKE ONE TABLET BY MOUTH ONCE DAILY 07/02/15   [provider]  fluticasone (FLONASE) 50 MCG/ACT nasal spray USE TWO SPRAY(S) IN EACH NOSTRIL ONCE DAILY 06/02/15   [provider]  hydrochlorothiazide (HYDRODIURIL) 25 MG tablet Take 25 mg by mouth.    [provider]  HYDROcodone-acetaminophen (NORCO/VICODIN) 5-325 MG tablet Take by mouth. 06/22/15   [provider]  latanoprost (XALATAN) 0.005 % ophthalmic solution Place 1 drop into both eyes at bedtime.      [provider]  levothyroxine (SYNTHROID, LEVOTHROID) 150 MCG tablet TAKE  ONE TABLET BY MOUTH ONCE DAILY 06/02/15   [provider]  losartan (COZAAR) 100 MG tablet Take 100 mg by mouth. 01/26/15   [provider]  meloxicam (MOBIC) 15 MG tablet Take 15 mg by mouth. 06/22/15   [provider]  methocarbamol (ROBAXIN) 500 MG tablet Take 1,000 mg by mouth. 06/22/15   [provider]  metoprolol succinate (TOPROL-XL) 25 MG 24 hr tablet  10/21/15   [provider]  mirtazapine (REMERON) 45 MG tablet TAKE ONE TABLET BY MOUTH ONCE DAILY AT NIGHT 03/23/15   [provider]  oxymetazoline (AFRIN) 0.05 % nasal spray Place 1 spray into the nose at bedtime.    [provider]  pantoprazole (PROTONIX) 40 MG tablet Take 40 mg by  mouth.    [provider]  warfarin (COUMADIN) 2.5 MG tablet Take 2.5 mg by mouth daily.    [provider]    Family History Family History  Problem Relation Age of Onset  . Cancer Father        THYROID CA-DECEASED  . Stroke Father        DECEASED  . Heart attack Mother        DECEASED  . Hypertension Mother        DECEASED  . Heart attack Sister        DECEASED  . Hypertension Sister   . Cancer Sister        BRAIN TUMOR-DECEASE  . Hypertension Sister        DECEASED  . Cancer Sister        PANCREAS-DECEASED  . Hypertension Sister   . Parkinsonism Brother        DECEASED  . Heart attack Brother        DECEASED    Social History Social History  Substance Use Topics  . Smoking status: Former Smoker    Packs/day: 2.00    Years: 10.00    Types: Cigarettes    Quit date: 01/11/1975  . Smokeless tobacco: Never Used  . Alcohol use No     Allergies   Tamsulosin and Penicillins   Review of Systems Review of Systems Ten systems reviewed and are negative for acute change, except as noted in the HPI.    Physical Exam Updated Vital Signs BP (!) 159/74 (BP Location: Left Arm)   Pulse 82   Temp 98.3 F (36.8 C) (Oral)   Resp 20   Ht 5' 11.5" (1.816 m)   SpO2 96%   Physical Exam   ED Treatments / Results  Labs (all labs ordered are listed, but only abnormal results are displayed) Labs Reviewed - No data to display  EKG  EKG Interpretation None       Radiology No results found.  Procedures Procedures (including critical care time)  Medications Ordered in ED Medications - No data to display   Initial Impression / Assessment and Plan / ED Course  I have reviewed the triage vital signs and the nursing notes.  Pertinent labs & imaging results that were available during my care of the patient were reviewed by me and considered in my medical decision making (see chart for details).    Patient with steroids and right lower ureteral  calculus. He feels greatly improved. Patient will be discharged with pain medication and antinausea medicine. No evidence of infection. He appears safe for discharge at this time. I discussed return precautions   Final Clinical Impressions(s) / ED Diagnoses   Final diagnoses:  Ureteral stone with hydronephrosis    New Prescriptions New Prescriptions   No medications on file     Margarita Mail, PA-C 09/24/16 1718    Little, Wenda Overland, MD 09/24/16 2135

## 2016-09-22 NOTE — ED Triage Notes (Signed)
Pt reports LLQ pain since last night radiating to L lower back. Denies fever, n/v/d, genitourinary symptoms.

## 2016-09-22 NOTE — Discharge Instructions (Signed)
Return to the ED immediately if you develop fever, uncontrolled pain or vomiting, or other concerns. ° °

## 2016-10-04 ENCOUNTER — Ambulatory Visit (INDEPENDENT_AMBULATORY_CARE_PROVIDER_SITE_OTHER): Payer: Medicare Other | Admitting: Podiatry

## 2016-10-04 ENCOUNTER — Encounter: Payer: Self-pay | Admitting: Podiatry

## 2016-10-04 DIAGNOSIS — M2042 Other hammer toe(s) (acquired), left foot: Secondary | ICD-10-CM

## 2016-10-04 DIAGNOSIS — B351 Tinea unguium: Secondary | ICD-10-CM

## 2016-10-04 DIAGNOSIS — L97501 Non-pressure chronic ulcer of other part of unspecified foot limited to breakdown of skin: Secondary | ICD-10-CM

## 2016-10-04 DIAGNOSIS — M79672 Pain in left foot: Secondary | ICD-10-CM

## 2016-10-04 DIAGNOSIS — M79671 Pain in right foot: Secondary | ICD-10-CM

## 2016-10-04 NOTE — Progress Notes (Signed)
SUBJECTIVE: 81 y.o.year old malepresents complaining of painful nails on both feet with dry skin.  2nd toe left foot is very painful.  Positive for bilateral knee problem and have numbness on both feet.  OBJECTIVE: DERMATOLOGIC EXAMINATION: Thick yellow and dystrophic nails x 10. Digital corn 2nd toe left at distal end with pain, intra dermal bleeding and ulcerative.Marland Kitchen  VASCULAR EXAMINATION OF LOWER LIMBS: Pedal pulses: All pedal pulses are palpable with normal pulsation.  Capillary Filling times within 3 seconds in all digits.  Positive of bilateral pedal edema. Temperature gradient from tibial crest to dorsum of foot is within normal bilateral.  NEUROLOGIC EXAMINATION OF THE LOWER LIMBS: Failed to respond Monofilament (Semmes-Weinstein 10-gm) sensory testing on both feet. Vibratory sensations(128Hz  turning fork) intact at medial and lateral forefoot bilateral.  Sharp and Dull discriminatory sensations at the plantar ball of hallux is intact bilateral.   MUSCULOSKELETAL EXAMINATION: Positive for severely contracted 2nd digit at MPJ and PIPJ left foot.   ASSESSMENT: Onychomycosis x 10. Hammer toe deformity with digital corn at distal end, symptomatic ulcerative intradermal bleeding left foot.  PLAN: Reviewed clinical findings and available treatment options. Both feet soaked. All nails and calluses debrided. Advised to return as soon as the toe start hurting to prevent ulceration.

## 2016-10-04 NOTE — Patient Instructions (Signed)
Seen for hypertrophic nails and painful toe 2nd left.. All nails debrided. Pre ulcerative lesion debrided. Return in 3 months or sooner if pain returns.

## 2016-11-01 ENCOUNTER — Emergency Department (HOSPITAL_BASED_OUTPATIENT_CLINIC_OR_DEPARTMENT_OTHER): Payer: Medicare Other

## 2016-11-01 ENCOUNTER — Encounter (HOSPITAL_BASED_OUTPATIENT_CLINIC_OR_DEPARTMENT_OTHER): Payer: Self-pay | Admitting: Emergency Medicine

## 2016-11-01 ENCOUNTER — Emergency Department (HOSPITAL_BASED_OUTPATIENT_CLINIC_OR_DEPARTMENT_OTHER)
Admission: EM | Admit: 2016-11-01 | Discharge: 2016-11-01 | Disposition: A | Discharge status: Home / Self Care | Payer: Medicare Other | Attending: Emergency Medicine | Admitting: Emergency Medicine

## 2016-11-01 DIAGNOSIS — Z96642 Presence of left artificial hip joint: Secondary | ICD-10-CM | POA: Insufficient documentation

## 2016-11-01 DIAGNOSIS — Z7982 Long term (current) use of aspirin: Secondary | ICD-10-CM | POA: Diagnosis not present

## 2016-11-01 DIAGNOSIS — Z85038 Personal history of other malignant neoplasm of large intestine: Secondary | ICD-10-CM | POA: Insufficient documentation

## 2016-11-01 DIAGNOSIS — R1011 Right upper quadrant pain: Secondary | ICD-10-CM

## 2016-11-01 DIAGNOSIS — Z79899 Other long term (current) drug therapy: Secondary | ICD-10-CM | POA: Insufficient documentation

## 2016-11-01 DIAGNOSIS — Z7901 Long term (current) use of anticoagulants: Secondary | ICD-10-CM | POA: Insufficient documentation

## 2016-11-01 DIAGNOSIS — I10 Essential (primary) hypertension: Secondary | ICD-10-CM | POA: Diagnosis not present

## 2016-11-01 DIAGNOSIS — Z96651 Presence of right artificial knee joint: Secondary | ICD-10-CM | POA: Diagnosis not present

## 2016-11-01 DIAGNOSIS — E039 Hypothyroidism, unspecified: Secondary | ICD-10-CM | POA: Insufficient documentation

## 2016-11-01 DIAGNOSIS — Z87891 Personal history of nicotine dependence: Secondary | ICD-10-CM | POA: Insufficient documentation

## 2016-11-01 DIAGNOSIS — Z96641 Presence of right artificial hip joint: Secondary | ICD-10-CM | POA: Diagnosis not present

## 2016-11-01 DIAGNOSIS — R1031 Right lower quadrant pain: Secondary | ICD-10-CM | POA: Diagnosis present

## 2016-11-01 LAB — COMPREHENSIVE METABOLIC PANEL
ALBUMIN: 3.5 g/dL (ref 3.5–5.0)
ALT: 33 U/L (ref 17–63)
AST: 37 U/L (ref 15–41)
Alkaline Phosphatase: 50 U/L (ref 38–126)
Anion gap: 10 (ref 5–15)
BUN: 16 mg/dL (ref 6–20)
CO2: 24 mmol/L (ref 22–32)
Calcium: 8.4 mg/dL — ABNORMAL LOW (ref 8.9–10.3)
Chloride: 104 mmol/L (ref 101–111)
Creatinine, Ser: 0.87 mg/dL (ref 0.61–1.24)
GFR calc Af Amer: >60 mL/min (ref >60)
Glucose, Bld: 111 mg/dL — ABNORMAL HIGH (ref 65–99)
Potassium: 3.7 mmol/L (ref 3.5–5.1)
Sodium: 138 mmol/L (ref 135–145)
Total Bilirubin: 0.7 mg/dL (ref 0.3–1.2)
Total Protein: 6.7 g/dL (ref 6.5–8.1)

## 2016-11-01 LAB — URINALYSIS, ROUTINE W REFLEX MICROSCOPIC
BILIRUBIN URINE: NEGATIVE
Glucose, UA: NEGATIVE mg/dL
KETONES UR: NEGATIVE mg/dL
NITRITE: NEGATIVE
PROTEIN: NEGATIVE mg/dL
Specific Gravity, Urine: <1.005 — ABNORMAL LOW (ref 1.005–1.030)
pH: 6 (ref 5.0–8.0)

## 2016-11-01 LAB — CBC
HEMATOCRIT: 42.3 % (ref 39.0–52.0)
Hemoglobin: 15 g/dL (ref 13.0–17.0)
MCH: 33.6 pg (ref 26.0–34.0)
MCHC: 35.5 g/dL (ref 30.0–36.0)
MCV: 94.8 fL (ref 78.0–100.0)
PLATELETS: 185 10*3/uL (ref 150–400)
RBC: 4.46 MIL/uL (ref 4.22–5.81)
RDW: 13.8 % (ref 11.5–15.5)
WBC: 7.9 10*3/uL (ref 4.0–10.5)

## 2016-11-01 LAB — URINALYSIS, MICROSCOPIC (REFLEX)

## 2016-11-01 LAB — LIPASE, BLOOD: LIPASE: 34 U/L (ref 11–51)

## 2016-11-01 MED ORDER — IOPAMIDOL (ISOVUE-300) INJECTION 61%
100.0000 mL | Freq: Once | INTRAVENOUS | Status: AC | PRN
  Administered 2016-11-01: 100 mL via INTRAVENOUS

## 2016-11-01 NOTE — ED Provider Notes (Signed)
[facility] EMERGENCY DEPARTMENT Provider Note   CSN: [id number] Arrival date & time: 11/01/16  4431     History   Chief Complaint Chief Complaint  Patient presents with  . Abdominal Pain    HPI Victor Lucero is a 81 y.o. male.  The history is provided by the patient. No language interpreter was used.  Abdominal Pain      Victor Lucero is a 81 y.o. male who presents to the Emergency Department complaining of abdominal pain.  He reports migratory right-sided abdominal pain that started at 3 AM this morning.  It woke him from sleep.  He has tried placing a heating pad on his abdomen with no significant relief.  Pain is constant in nature and initiated in his right upper quadrant but now is more in the right lower quadrant.  He denies any associated fevers, nausea, vomiting, shortness of breath, diarrhea, dysuria.  No prior similar symptoms.  He does have a history of bowel resection and PE as well as kidney stone.  This does not feel at all like his prior kidney stone.  He declines any pain medications in the emergency department.  Past Medical History:  Diagnosis Date  . Arthritis   . Cancer ([place])   . Colon cancer ([place])   . Dysphasia   . Hyperlipemia   . Hypertension   . Hypothyroidism   . Insomnia   . Pulmonary embolism ([place])   . PVC (premature ventricular contraction)   . S/P cardiac catheterization 2008   with minimal irregularities and a normal EF  . S/P TKR (total knee replacement)    R KNEE  . Sleep apnea    CPAP    Patient Active Problem List   Diagnosis Date Noted  . Chest pain 03/29/2011  . Hypertension   . Hypothyroidism   . Hyperlipemia   . Sleep apnea   . S/P TKR (total knee replacement)   . Dysphasia   . PVC (premature ventricular contraction)     Past Surgical History:  Procedure Laterality Date  . APPENDECTOMY  2003  . BOWEL     CANCER REMOVAL  . CARDIAC CATHETERIZATION  08/07/06  . CARPAL TUNNEL RELEASE    . THYROIDECTOMY  1970'S   . TONSILLECTOMY AND ADENOIDECTOMY     AGE 38  . TOTAL HIP ARTHROPLASTY Bilateral   . TOTAL KNEE ARTHROPLASTY  04/08/08   R KNEE       Home Medications    Prior to Admission medications   Medication Sig Start Date End Date Taking? Authorizing Provider  amLODipine (NORVASC) 5 MG tablet  09/03/15   [provider]  aspirin 81 MG chewable tablet Chew by mouth daily.    [provider]  azelastine (ASTELIN) 137 MCG/SPRAY nasal spray Place 2 sprays into the nose 2 (two) times daily. Use in each nostril as directed    [provider]  cetirizine (ZYRTEC) 10 MG tablet Take 10 mg by mouth.    [provider]  diphenhydrAMINE (BENADRYL) 50 MG capsule Take 50 mg by mouth daily.     [provider]  finasteride (PROSCAR) 5 MG tablet TAKE ONE TABLET BY MOUTH ONCE DAILY 07/02/15   [provider]  fluticasone (FLONASE) 50 MCG/ACT nasal spray USE TWO SPRAY(S) IN EACH NOSTRIL ONCE DAILY 06/02/15   [provider]  hydrochlorothiazide (HYDRODIURIL) 25 MG tablet Take 25 mg by mouth.    [provider]  HYDROcodone-acetaminophen (NORCO/VICODIN) 5-325 MG tablet Take  by mouth. 06/22/15   [provider]  latanoprost (XALATAN) 0.005 % ophthalmic solution Place 1 drop into both eyes at bedtime.      [provider]  levothyroxine (SYNTHROID, LEVOTHROID) 150 MCG tablet TAKE ONE TABLET BY MOUTH ONCE DAILY 06/02/15   [provider]  losartan (COZAAR) 100 MG tablet Take 100 mg by mouth. 01/26/15   [provider]  meloxicam (MOBIC) 15 MG tablet Take 15 mg by mouth. 06/22/15   [provider]  methocarbamol (ROBAXIN) 500 MG tablet Take 1,000 mg by mouth. 06/22/15   [provider]  metoprolol succinate (TOPROL-XL) 25 MG 24 hr tablet  10/21/15   [provider]  mirtazapine (REMERON) 45 MG tablet TAKE ONE TABLET BY MOUTH ONCE DAILY AT NIGHT 03/23/15   [provider]  ondansetron  (ZOFRAN) 4 MG tablet Take 1 tablet (4 mg total) by mouth 4 (four) times daily as needed for nausea or vomiting. 09/22/16   Margarita Mail, PA-C  oxyCODONE (ROXICODONE) 5 MG immediate release tablet Take 0.5-1 tablets (2.5-5 mg total) by mouth every 4 (four) hours as needed for severe pain. 09/22/16   Margarita Mail, PA-C  oxymetazoline (AFRIN) 0.05 % nasal spray Place 1 spray into the nose at bedtime.    [provider]  pantoprazole (PROTONIX) 40 MG tablet Take 40 mg by mouth.    [provider]  warfarin (COUMADIN) 2.5 MG tablet Take 2.5 mg by mouth daily.    [provider]    Family History Family History  Problem Relation Age of Onset  . Cancer Father        THYROID CA-DECEASED  . Stroke Father        DECEASED  . Heart attack Mother        DECEASED  . Hypertension Mother        DECEASED  . Heart attack Sister        DECEASED  . Hypertension Sister   . Cancer Sister        BRAIN TUMOR-DECEASE  . Hypertension Sister        DECEASED  . Cancer Sister        PANCREAS-DECEASED  . Hypertension Sister   . Parkinsonism Brother        DECEASED  . Heart attack Brother        DECEASED    Social History Social History  Substance Use Topics  . Smoking status: Former Smoker    Packs/day: 2.00    Years: 10.00    Types: Cigarettes    Quit date: 01/11/1975  . Smokeless tobacco: Never Used  . Alcohol use No     Allergies   Tamsulosin and Penicillins   Review of Systems Review of Systems  Gastrointestinal: Positive for abdominal pain.  All other systems reviewed and are negative.    Physical Exam Updated Vital Signs BP (!) 163/86 (BP Location: Left Arm)   Pulse 69   Temp 98.2 F (36.8 C) (Oral)   Resp 20   Ht 5' 11.5" (1.816 m)   Wt (!) 154.2 kg (340 lb)   SpO2 100%   BMI 46.76 kg/m   Physical Exam  Constitutional: He is oriented to person, place, and time. He appears well-developed and well-nourished.  HENT:  Head: Normocephalic and  atraumatic.  Cardiovascular: Normal rate and regular rhythm.   No murmur heard. Pulmonary/Chest: Effort normal and breath sounds normal. No respiratory distress.  Abdominal: There is no rebound and no guarding.  Obese abdomen.  Mild right-sided abdominal tenderness without any guarding or rebound  Musculoskeletal: He exhibits no edema or tenderness.  Neurological: He is alert and oriented to person, place, and time.  Skin: Skin is warm and dry.  Psychiatric: He has a normal mood and affect. His behavior is normal.  Nursing note and vitals reviewed.    ED Treatments / Results  Labs (all labs ordered are listed, but only abnormal results are displayed) Labs Reviewed  COMPREHENSIVE METABOLIC PANEL - Abnormal; Notable for the following:       Result Value   Glucose, Bld 111 (*)    Calcium 8.4 (*)    All other components within normal limits  URINALYSIS, ROUTINE W REFLEX MICROSCOPIC - Abnormal; Notable for the following:    Specific Gravity, Urine <1.005 (*)    Hgb urine dipstick TRACE (*)    Leukocytes, UA TRACE (*)    All other components within normal limits  URINALYSIS, MICROSCOPIC (REFLEX) - Abnormal; Notable for the following:    Bacteria, UA RARE (*)    Squamous Epithelial / LPF 0-5 (*)    All other components within normal limits  LIPASE, BLOOD  CBC    EKG  EKG Interpretation None       Radiology Ct Abdomen Pelvis W Contrast  Result Date: 11/01/2016 CLINICAL DATA:  Abdominal pain. EXAM: CT ABDOMEN AND PELVIS WITH CONTRAST TECHNIQUE: Multidetector CT imaging of the abdomen and pelvis was performed using the standard protocol following bolus administration of intravenous contrast. CONTRAST:  142mL ISOVUE-300 IOPAMIDOL (ISOVUE-300) INJECTION 61% COMPARISON:  09/22/2016 FINDINGS: Lower chest: Lung bases are clear. No effusions. Heart is normal size. Hepatobiliary: Fatty infiltration of the liver. No focal abnormality or biliary ductal dilatation. Gallbladder  unremarkable. Pancreas: No focal abnormality or ductal dilatation. Spleen: Scattered calcifications compatible with old granulomatous disease. Adrenals/Urinary Tract: Bilateral renal cysts, right greater than left. 14 mm nonobstructing stone in the lower pole of the right kidney, stable. No ureteral stones or hydronephrosis. The distal ureter and scratched at the distal ureters and bladder are obscured by beam hardening artifact from bilateral hip replacements. Adrenal glands are unremarkable. Stomach/Bowel: Extensive sigmoid diverticulosis. No active diverticulitis. Stomach and small bowel decompressed, unremarkable. Vascular/Lymphatic: Aortic and iliac calcifications. No aneurysm or adenopathy. Reproductive: No visible focal abnormality. Other: No free fluid or free air. Musculoskeletal: Degenerative changes in the lumbar spine. No acute bony abnormality. IMPRESSION: Right lower pole nephrolithiasis, stable. No ureteral stones or hydronephrosis. Bilateral renal cysts. Sigmoid diverticulosis. Fatty infiltration of the liver. Electronically Signed   By: Rolm Baptise M.D.   On: 11/01/2016 11:55    Procedures Procedures (including critical care time)  Medications Ordered in ED Medications  iopamidol (ISOVUE-300) 61 % injection 100 mL (100 mLs Intravenous Contrast Given 11/01/16 1134)     Initial Impression / Assessment and Plan / ED Course  I have reviewed the triage vital signs and the nursing notes.  Pertinent labs & imaging results that were available during my care of the patient were reviewed by me and considered in my medical decision making (see chart for details).    Pt here for evaluation of right sided abdominal pain.  No peritoneal findings on exam, pt is well appearing.  Labs with no significant abnormality. Imaging negative for acute disease process.  D/w pt home care for abdominal pain, possible abdominal wall strain.  Discussed outpatient follow up and return precautions.    Final  Clinical Impressions(s) / ED Diagnoses   Final diagnoses:  Right upper quadrant abdominal pain    New Prescriptions Discharge Medication List as of 11/01/2016  1:02 PM       Quintella Reichert, MD 11/01/16 1910

## 2016-11-01 NOTE — ED Triage Notes (Signed)
Patient states that he has had pain to his right upper quadrant since 3 am  - patient denies any N/V/D

## 2016-12-07 ENCOUNTER — Encounter: Payer: Self-pay | Admitting: Podiatry

## 2016-12-07 ENCOUNTER — Ambulatory Visit (INDEPENDENT_AMBULATORY_CARE_PROVIDER_SITE_OTHER): Payer: Medicare Other | Admitting: Podiatry

## 2016-12-07 DIAGNOSIS — G9009 Other idiopathic peripheral autonomic neuropathy: Secondary | ICD-10-CM

## 2016-12-07 DIAGNOSIS — M79671 Pain in right foot: Secondary | ICD-10-CM

## 2016-12-07 DIAGNOSIS — L97501 Non-pressure chronic ulcer of other part of unspecified foot limited to breakdown of skin: Secondary | ICD-10-CM

## 2016-12-07 DIAGNOSIS — M2042 Other hammer toe(s) (acquired), left foot: Secondary | ICD-10-CM

## 2016-12-07 DIAGNOSIS — M79672 Pain in left foot: Secondary | ICD-10-CM

## 2016-12-07 NOTE — Progress Notes (Signed)
Subjective: 81 y.o. year old male patient presents complaining of pain in the 2nd toe left foot.  He called this morning and came in.  Stated that the toe bled after he attempted to clip off some nail thinking that was the cause of foot pain.  Hx of knee problem with numbness in both feet.  Objective: Dermatologic: Cracked open skin lesion along the margins of thick callus at the distal end with severely deformed nail 2nd toe left. No associated edema or erythema noted. Vascular: Pedal pulses are all palpable. Orthopedic: Severe hammer toe deformity 2nd left with skin lesion. Neurologic: Absence of protective sensory perception both feet.  Assessment: Ulcerating corn with torn dermal tissue, involvement limited to dermal layer. Severe hammer toe 2nd left. Severe deformed nail 2nd toe left. Peripheral neuropathy.  Treatment: Abnormal nail and excess callus debrided. Wound cleansed with Iodine and Amerigel dressing applied. Left foot placed in surgical shoe. Patient is to keep the dressing on till the next appointment. Return in 5 days.

## 2016-12-07 NOTE — Patient Instructions (Signed)
Ulcerated skin lesion debrided and dressed. Left foot placed in surgical shoe.  Return in 5 days.

## 2016-12-12 ENCOUNTER — Ambulatory Visit: Payer: Medicare Other | Admitting: Podiatry

## 2016-12-12 ENCOUNTER — Ambulatory Visit (INDEPENDENT_AMBULATORY_CARE_PROVIDER_SITE_OTHER): Payer: Medicare Other | Admitting: Podiatry

## 2016-12-12 ENCOUNTER — Encounter: Payer: Self-pay | Admitting: Podiatry

## 2016-12-12 DIAGNOSIS — B351 Tinea unguium: Secondary | ICD-10-CM

## 2016-12-12 DIAGNOSIS — G9009 Other idiopathic peripheral autonomic neuropathy: Secondary | ICD-10-CM

## 2016-12-12 DIAGNOSIS — Q828 Other specified congenital malformations of skin: Secondary | ICD-10-CM

## 2016-12-12 DIAGNOSIS — L97501 Non-pressure chronic ulcer of other part of unspecified foot limited to breakdown of skin: Secondary | ICD-10-CM | POA: Diagnosis not present

## 2016-12-12 NOTE — Progress Notes (Signed)
Subjective: 81 y.o. year old male patient presents for follow up on 2nd toe nail wound. Toe is still wrapped under dressing that was placed last Thursday.  Objective: Dermatologic: Thick yellow deformed nails x 10. Fissured callus under 2nd distal end left foot. No drainage, mild skin damage with mild localized inflammation noted. Vascular: Pedal pulses are all palpable. Orthopedic: Severely contracted 2nd toe left with skin lesion. Neurologic: Loss of protective tactile sensation both feet.  Assessment: Severely dystrophic mycotic nails x 10. Fissured callus 2nd toe left. Peripheral neuropathy. Hammer toe deformity 2nd left.  Treatment: Both feet soaked for 10 minutes. All mycotic nails debrided.  2nd toe wound debrided and dressed with Amerigel ointment. Home care instruction given. Amerigel ointment dispensed. Return in 2 weeks.

## 2016-12-12 NOTE — Patient Instructions (Signed)
Seen for hypertrophic nails. All nails debrided. Return in 3 months or as needed.  

## 2016-12-26 ENCOUNTER — Encounter: Payer: Self-pay | Admitting: Podiatry

## 2016-12-26 ENCOUNTER — Ambulatory Visit (INDEPENDENT_AMBULATORY_CARE_PROVIDER_SITE_OTHER): Payer: Medicare Other | Admitting: Podiatry

## 2016-12-26 DIAGNOSIS — G9009 Other idiopathic peripheral autonomic neuropathy: Secondary | ICD-10-CM | POA: Diagnosis not present

## 2016-12-26 DIAGNOSIS — M2042 Other hammer toe(s) (acquired), left foot: Secondary | ICD-10-CM | POA: Diagnosis not present

## 2016-12-26 DIAGNOSIS — L97521 Non-pressure chronic ulcer of other part of left foot limited to breakdown of skin: Secondary | ICD-10-CM

## 2016-12-26 NOTE — Patient Instructions (Signed)
Follow up on left 2nd toe ulcer. Debrided and padded.  Keep the pad during the day and remove at night. Replace band aid as needed. Return in 1 week.

## 2016-12-26 NOTE — Progress Notes (Signed)
Subjective: 81 y.o. year old male patient presents for follow up on 2nd toe ulcer lft foot.  Stated that 2nd toe left foot is very painful at the base of nail plate area. Patient is wearing post op shoe on left foot.  Objective: Dermatologic: Ulcerated digital corn distal end 2nd toe left with severe pain at the nail base. Vascular: Pedal pulses are all palpable. Orthopedic: Severe hammer toe deformity with digital corn 2nd left. Neurologic: Loss of protective tactile sensation both feet.  Assessment: Ulcerated corn 2nd left. Peripheral neuropathy bilateral. Hammer toe deformity 2nd left. Severe Xerosis both feet.  Treatment: Left foot soaked for 10 minutes. Devitalized tissue debrided from ulcerating corn 2nd left. Buttress pad placed with home care instruction. Amerigel ointment dressing applied to the 2nd toe. Will discuss possible Flexor tenotomy of the 2nd toe left to prevent recurring ulcer. Return in one week.

## 2017-01-05 ENCOUNTER — Ambulatory Visit (INDEPENDENT_AMBULATORY_CARE_PROVIDER_SITE_OTHER): Payer: Medicare Other | Admitting: Podiatry

## 2017-01-05 ENCOUNTER — Encounter: Payer: Self-pay | Admitting: Podiatry

## 2017-01-05 DIAGNOSIS — L97521 Non-pressure chronic ulcer of other part of left foot limited to breakdown of skin: Secondary | ICD-10-CM

## 2017-01-05 DIAGNOSIS — G9009 Other idiopathic peripheral autonomic neuropathy: Secondary | ICD-10-CM | POA: Diagnosis not present

## 2017-01-05 DIAGNOSIS — M79672 Pain in left foot: Secondary | ICD-10-CM | POA: Diagnosis not present

## 2017-01-05 DIAGNOSIS — M79671 Pain in right foot: Secondary | ICD-10-CM

## 2017-01-05 DIAGNOSIS — M2042 Other hammer toe(s) (acquired), left foot: Secondary | ICD-10-CM

## 2017-01-05 NOTE — Patient Instructions (Signed)
Follow up on right foot 2nd toe lesion. Wound healing in progress. All nails debrided. Lesion debrided and padded. Return in 2 weeks.

## 2017-01-05 NOTE — Progress Notes (Signed)
Subjective: 81 y.o. year old male patient presents for follow up on 2nd toe ulcer left foot. Also complaining of 4th toe right from piercing toe nail to the next toe. Been using buttress pad under the 2nd toe left. Patient is not able to care for own personal hygiene on lower limbs due to unable to bend and reach down to feet.  Objective: Dermatologic: Thick yellow deformed nails x 10. Healing ulcer distal end 2nd toe with intradermal bleeding. Pain with weight bearing if pad is not placed. Thick dry peeling skin on both feet. Vascular: Pedal pulses are all palpable. Orthopedic: Severe hammer toe deformity 2nd left. Neurologic: Loss of protective tactile sensations on both feet.  Assessment: Dystrophic mycotic nails x 10. Ulcer distal end 2nd toe left, limited to breakdown of skin. Severe hammer toe 2nd left. Peripheral neuropathy bilateral. Xerotic skin bilateral.  Treatment: Ulcerating 2nd toe left, all mycotic nails debrided.  Buttress pad replaced under the 2nd toe left. Will discuss possible Flexor tenotomy of the 2nd toe to prevent recurrence of digital ulcer. Continue with post op shoe on left foot. Return in 2 weeks.

## 2017-01-19 ENCOUNTER — Ambulatory Visit: Payer: Medicare Other | Admitting: Podiatry

## 2017-02-07 ENCOUNTER — Ambulatory Visit (INDEPENDENT_AMBULATORY_CARE_PROVIDER_SITE_OTHER): Payer: Medicare Other | Admitting: Podiatry

## 2017-02-07 ENCOUNTER — Encounter: Payer: Self-pay | Admitting: Podiatry

## 2017-02-07 DIAGNOSIS — G9009 Other idiopathic peripheral autonomic neuropathy: Secondary | ICD-10-CM

## 2017-02-07 DIAGNOSIS — L97521 Non-pressure chronic ulcer of other part of left foot limited to breakdown of skin: Secondary | ICD-10-CM

## 2017-02-07 DIAGNOSIS — M2042 Other hammer toe(s) (acquired), left foot: Secondary | ICD-10-CM

## 2017-02-07 NOTE — Progress Notes (Signed)
Subjective: 82 y.o. year old male patient presents for follow up on left foot 2nd toe ulcer. He missed last appointment 2 weeks ago due to death of his wife. He is planning on moving to a facility in [place]. Stated that his toe feels better.  Objective: Dermatologic: Thick yellow deformed nails bilateral. Digital ulcer callused with intradermal bleeding 2nd toe left. Vascular: Pedal pulses are all palpable. Orthopedic: Contracted lesser digit 2nd left with skin lesion.  Neurologic: Loss of protective tactile sensations both feet.  Assessment: Dystrophic mycotic nails x 10. Ulcer 2nd toe left, limited to breakdown of skin. Peripheral neuropathy.  Treatment: Ulcerating 2nd toe left debrided. Buttress pad placed. Return in 1 month.

## 2017-02-07 NOTE — Patient Instructions (Signed)
Seen for follow up on 2nd toe ulcer left foot. Noted of improvement. Lesion debrided and padded.  Return in 1 month or sooner if needed.

## 2017-03-09 ENCOUNTER — Encounter: Payer: Self-pay | Admitting: Podiatry

## 2017-03-09 ENCOUNTER — Ambulatory Visit (INDEPENDENT_AMBULATORY_CARE_PROVIDER_SITE_OTHER): Payer: Medicare Other | Admitting: Podiatry

## 2017-03-09 DIAGNOSIS — G9009 Other idiopathic peripheral autonomic neuropathy: Secondary | ICD-10-CM

## 2017-03-09 DIAGNOSIS — B351 Tinea unguium: Secondary | ICD-10-CM

## 2017-03-09 DIAGNOSIS — M79672 Pain in left foot: Secondary | ICD-10-CM

## 2017-03-09 DIAGNOSIS — M79671 Pain in right foot: Secondary | ICD-10-CM | POA: Diagnosis not present

## 2017-03-09 NOTE — Patient Instructions (Signed)
Follow up on left foot lesion. Wound healed well. All nails are thick and hypertrophic. Both feet soaked and cleansed. All lesions and nails debrided. Return in 2 month.

## 2017-03-09 NOTE — Progress Notes (Signed)
Subjective: 82 y.o. year old male patient presents for follow up on left toe lesion. He is using gel toe pad under the 2nd toe left foot. Right foot toe nails are thick and hurting to walk. He is planning to move to a facility in [place] in next 2 months.  Objective: Dermatologic: Thick yellow deformed nails x 10. Healed 2nd toe lesion left foot. Vascular: Pedal pulses are all palpable. Hyperpigmented varicose veins on medial ankle bilateral. Orthopedic: Contracted lesser digits 2-5 bilateral. Neurologic: Loss of protective tactile sensations bilateral.  Assessment: Dystrophic mycotic nails x 10. Healed 2nd toe lesion left foot. Severe digital contracture bilateral. Xerotic skin both lower limbs. Peripheral neuropathy.  Treatment: All mycotic nails, corns, calluses debrided.  Return in 2 months or sooner if needed.

## 2017-03-23 ENCOUNTER — Encounter (HOSPITAL_BASED_OUTPATIENT_CLINIC_OR_DEPARTMENT_OTHER): Payer: Self-pay | Admitting: Emergency Medicine

## 2017-03-23 ENCOUNTER — Emergency Department (HOSPITAL_COMMUNITY): Payer: Medicare Other

## 2017-03-23 ENCOUNTER — Emergency Department (HOSPITAL_BASED_OUTPATIENT_CLINIC_OR_DEPARTMENT_OTHER)
Admission: EM | Admit: 2017-03-23 | Discharge: 2017-03-24 | Disposition: A | Discharge status: Home / Self Care | Payer: Medicare Other | Attending: Emergency Medicine | Admitting: Emergency Medicine

## 2017-03-23 ENCOUNTER — Other Ambulatory Visit: Payer: Self-pay

## 2017-03-23 ENCOUNTER — Emergency Department (HOSPITAL_BASED_OUTPATIENT_CLINIC_OR_DEPARTMENT_OTHER): Payer: Medicare Other

## 2017-03-23 DIAGNOSIS — Z7982 Long term (current) use of aspirin: Secondary | ICD-10-CM | POA: Diagnosis not present

## 2017-03-23 DIAGNOSIS — Z96651 Presence of right artificial knee joint: Secondary | ICD-10-CM | POA: Diagnosis not present

## 2017-03-23 DIAGNOSIS — Z87891 Personal history of nicotine dependence: Secondary | ICD-10-CM | POA: Diagnosis not present

## 2017-03-23 DIAGNOSIS — Z85038 Personal history of other malignant neoplasm of large intestine: Secondary | ICD-10-CM | POA: Diagnosis not present

## 2017-03-23 DIAGNOSIS — D320 Benign neoplasm of cerebral meninges: Secondary | ICD-10-CM | POA: Diagnosis not present

## 2017-03-23 DIAGNOSIS — E039 Hypothyroidism, unspecified: Secondary | ICD-10-CM | POA: Diagnosis not present

## 2017-03-23 DIAGNOSIS — Z7901 Long term (current) use of anticoagulants: Secondary | ICD-10-CM | POA: Diagnosis not present

## 2017-03-23 DIAGNOSIS — Z96643 Presence of artificial hip joint, bilateral: Secondary | ICD-10-CM | POA: Diagnosis not present

## 2017-03-23 DIAGNOSIS — I10 Essential (primary) hypertension: Secondary | ICD-10-CM | POA: Insufficient documentation

## 2017-03-23 DIAGNOSIS — Z79899 Other long term (current) drug therapy: Secondary | ICD-10-CM | POA: Insufficient documentation

## 2017-03-23 DIAGNOSIS — R42 Dizziness and giddiness: Secondary | ICD-10-CM | POA: Diagnosis present

## 2017-03-23 DIAGNOSIS — R269 Unspecified abnormalities of gait and mobility: Secondary | ICD-10-CM | POA: Diagnosis not present

## 2017-03-23 LAB — URINALYSIS, MICROSCOPIC (REFLEX)

## 2017-03-23 LAB — URINALYSIS, ROUTINE W REFLEX MICROSCOPIC
Bilirubin Urine: NEGATIVE
Glucose, UA: NEGATIVE mg/dL
Hgb urine dipstick: NEGATIVE
Ketones, ur: NEGATIVE mg/dL
NITRITE: NEGATIVE
PROTEIN: NEGATIVE mg/dL
SPECIFIC GRAVITY, URINE: 1.025 (ref 1.005–1.030)
pH: 6 (ref 5.0–8.0)

## 2017-03-23 LAB — CBC
HEMATOCRIT: 46.3 % (ref 39.0–52.0)
HEMOGLOBIN: 16.2 g/dL (ref 13.0–17.0)
MCH: 34.1 pg — AB (ref 26.0–34.0)
MCHC: 35 g/dL (ref 30.0–36.0)
MCV: 97.5 fL (ref 78.0–100.0)
Platelets: 196 10*3/uL (ref 150–400)
RBC: 4.75 MIL/uL (ref 4.22–5.81)
RDW: 13.7 % (ref 11.5–15.5)
WBC: 9.1 10*3/uL (ref 4.0–10.5)

## 2017-03-23 LAB — RAPID URINE DRUG SCREEN, HOSP PERFORMED
Amphetamines: NOT DETECTED
BARBITURATES: NOT DETECTED
Benzodiazepines: NOT DETECTED
Cocaine: NOT DETECTED
Opiates: NOT DETECTED
Tetrahydrocannabinol: NOT DETECTED

## 2017-03-23 LAB — DIFFERENTIAL
BASOS ABS: 0 10*3/uL (ref 0.0–0.1)
Basophils Relative: 0 %
EOS PCT: 1 %
Eosinophils Absolute: 0.1 10*3/uL (ref 0.0–0.7)
LYMPHS ABS: 2.6 10*3/uL (ref 0.7–4.0)
LYMPHS PCT: 29 %
MONOS PCT: 9 %
Monocytes Absolute: 0.8 10*3/uL (ref 0.1–1.0)
Neutro Abs: 5.5 10*3/uL (ref 1.7–7.7)
Neutrophils Relative %: 61 %

## 2017-03-23 LAB — APTT: aPTT: 41 seconds — ABNORMAL HIGH (ref 24–36)

## 2017-03-23 LAB — COMPREHENSIVE METABOLIC PANEL
ALK PHOS: 57 U/L (ref 38–126)
ALT: 39 U/L (ref 17–63)
AST: 39 U/L (ref 15–41)
Albumin: 3.8 g/dL (ref 3.5–5.0)
Anion gap: 10 (ref 5–15)
BILIRUBIN TOTAL: 0.8 mg/dL (ref 0.3–1.2)
BUN: 18 mg/dL (ref 6–20)
CALCIUM: 9 mg/dL (ref 8.9–10.3)
CO2: 25 mmol/L (ref 22–32)
Chloride: 107 mmol/L (ref 101–111)
Creatinine, Ser: 0.93 mg/dL (ref 0.61–1.24)
GFR calc Af Amer: >60 mL/min (ref >60)
GLUCOSE: 90 mg/dL (ref 65–99)
Potassium: 4 mmol/L (ref 3.5–5.1)
Sodium: 142 mmol/L (ref 135–145)
TOTAL PROTEIN: 7 g/dL (ref 6.5–8.1)

## 2017-03-23 LAB — PROTIME-INR
INR: 1.17
Prothrombin Time: 14.8 seconds (ref 11.4–15.2)

## 2017-03-23 LAB — ETHANOL: Alcohol, Ethyl (B): <10 mg/dL (ref <10)

## 2017-03-23 LAB — TROPONIN I

## 2017-03-23 NOTE — ED Provider Notes (Signed)
[facility] EMERGENCY DEPARTMENT Provider Note   CSN: [id number] Arrival date & time: 03/23/17  1243     History   Chief Complaint Chief Complaint  Patient presents with  . Balance issues    HPI Victor Lucero is a 82 y.o. male.  HPI  82 year old male presents with lightheadedness and being off balance.  He states he has had a lightheaded feeling on and off for the last 2 years and has been worked up with an MRI as an outpatient.  He takes meclizine on and off for dizziness associated with vertigo.  Since yesterday has been feeling off balance which he describes as trouble walking because he feels that he is going to fall over.  This morning it was worse and he actually fell but landed on his bed.  He feels like he is leaning towards the left when he tries to walk.  There is no specific room spinning sensation but he does feel little lightheaded and feels tired like he needs to go to sleep.  He denies headache, focal weakness or numbness, blurry vision, chest pain.  Past Medical History:  Diagnosis Date  . Arthritis   . Cancer ([place])   . Colon cancer ([place])   . Dysphasia   . Hyperlipemia   . Hypertension   . Hypothyroidism   . Insomnia   . Pulmonary embolism ([place])   . PVC (premature ventricular contraction)   . S/P cardiac catheterization 2008   with minimal irregularities and a normal EF  . S/P TKR (total knee replacement)    R KNEE  . Sleep apnea    CPAP    Patient Active Problem List   Diagnosis Date Noted  . Chest pain 03/29/2011  . Hypertension   . Hypothyroidism   . Hyperlipemia   . Sleep apnea   . S/P TKR (total knee replacement)   . Dysphasia   . PVC (premature ventricular contraction)     Past Surgical History:  Procedure Laterality Date  . APPENDECTOMY  2003  . BOWEL     CANCER REMOVAL  . CARDIAC CATHETERIZATION  08/07/06  . CARPAL TUNNEL RELEASE    . THYROIDECTOMY  1970'S  . TONSILLECTOMY AND ADENOIDECTOMY     AGE 44  . TOTAL HIP  ARTHROPLASTY Bilateral   . TOTAL KNEE ARTHROPLASTY  04/08/08   R KNEE       Home Medications    Prior to Admission medications   Medication Sig Start Date End Date Taking? Authorizing Provider  amLODipine (NORVASC) 5 MG tablet  09/03/15   [provider]  aspirin 81 MG chewable tablet Chew by mouth daily.    [provider]  azelastine (ASTELIN) 137 MCG/SPRAY nasal spray Place 2 sprays into the nose 2 (two) times daily. Use in each nostril as directed    [provider]  cetirizine (ZYRTEC) 10 MG tablet Take 10 mg by mouth.    [provider]  diphenhydrAMINE (BENADRYL) 50 MG capsule Take 50 mg by mouth daily.     [provider]  finasteride (PROSCAR) 5 MG tablet TAKE ONE TABLET BY MOUTH ONCE DAILY 07/02/15   [provider]  fluticasone (FLONASE) 50 MCG/ACT nasal spray USE TWO SPRAY(S) IN EACH NOSTRIL ONCE DAILY 06/02/15   [provider]  hydrochlorothiazide (HYDRODIURIL) 25 MG tablet Take 25 mg by mouth.    [provider]  HYDROcodone-acetaminophen (NORCO/VICODIN) 5-325 MG tablet Take by mouth. 06/22/15   [provider]  latanoprost (  XALATAN) 0.005 % ophthalmic solution Place 1 drop into both eyes at bedtime.      [provider]  levothyroxine (SYNTHROID, LEVOTHROID) 150 MCG tablet TAKE ONE TABLET BY MOUTH ONCE DAILY 06/02/15   [provider]  losartan (COZAAR) 100 MG tablet Take 100 mg by mouth. 01/26/15   [provider]  meloxicam (MOBIC) 15 MG tablet Take 15 mg by mouth. 06/22/15   [provider]  methocarbamol (ROBAXIN) 500 MG tablet Take 1,000 mg by mouth. 06/22/15   [provider]  metoprolol succinate (TOPROL-XL) 25 MG 24 hr tablet  10/21/15   [provider]  mirtazapine (REMERON) 45 MG tablet TAKE ONE TABLET BY MOUTH ONCE DAILY AT NIGHT 03/23/15   [provider]  ondansetron (ZOFRAN) 4 MG tablet Take 1 tablet (4 mg total) by mouth 4  (four) times daily as needed for nausea or vomiting. 09/22/16   Margarita Mail, PA-C  oxyCODONE (ROXICODONE) 5 MG immediate release tablet Take 0.5-1 tablets (2.5-5 mg total) by mouth every 4 (four) hours as needed for severe pain. 09/22/16   Margarita Mail, PA-C  oxymetazoline (AFRIN) 0.05 % nasal spray Place 1 spray into the nose at bedtime.    [provider]  pantoprazole (PROTONIX) 40 MG tablet Take 40 mg by mouth.    [provider]  warfarin (COUMADIN) 2.5 MG tablet Take 2.5 mg by mouth daily.    [provider]  zolpidem (AMBIEN) 10 MG tablet Take 10 mg by mouth at bedtime as needed.    03/29/11  [provider]    Family History Family History  Problem Relation Age of Onset  . Cancer Father        THYROID CA-DECEASED  . Stroke Father        DECEASED  . Heart attack Mother        DECEASED  . Hypertension Mother        DECEASED  . Heart attack Sister        DECEASED  . Hypertension Sister   . Cancer Sister        BRAIN TUMOR-DECEASE  . Hypertension Sister        DECEASED  . Cancer Sister        PANCREAS-DECEASED  . Hypertension Sister   . Parkinsonism Brother        DECEASED  . Heart attack Brother        DECEASED    Social History Social History   Tobacco Use  . Smoking status: Former Smoker    Packs/day: 2.00    Years: 10.00    Pack years: 20.00    Types: Cigarettes    Last attempt to quit: 01/11/1975    Years since quitting: 42.2  . Smokeless tobacco: Never Used  Substance Use Topics  . Alcohol use: No  . Drug use: No     Allergies   Tamsulosin and Penicillins   Review of Systems Review of Systems  Eyes: Negative for visual disturbance.  Cardiovascular: Negative for chest pain.  Musculoskeletal: Positive for gait problem.  Neurological: Positive for light-headedness. Negative for weakness, numbness and headaches.  All other systems reviewed and are negative.    Physical Exam Updated Vital Signs BP 130/80    Pulse 77   Temp 99 F (37.2 C) (Oral)   Resp 16   Ht 5\' 10"  (1.778 m)   Wt (!) 156.5 kg (345 lb)   SpO2 97%   BMI 49.50 kg/m   Physical  Exam  Constitutional: He is oriented to person, place, and time. He appears well-developed and well-nourished. No distress.  obese  HENT:  Head: Normocephalic and atraumatic.  Right Ear: External ear normal.  Left Ear: External ear normal.  Nose: Nose normal.  Eyes: EOM are normal. Pupils are equal, round, and reactive to light. Right eye exhibits no discharge. Left eye exhibits no discharge.  Neck: Neck supple.  Cardiovascular: Normal rate, regular rhythm and normal heart sounds.  Pulmonary/Chest: Effort normal and breath sounds normal.  Abdominal: Soft. There is no tenderness.  Musculoskeletal: He exhibits no edema.  Neurological: He is alert and oriented to person, place, and time.  CN 3-12 grossly intact. 5/5 strength in all 4 extremities. Grossly normal sensation. Normal finger to nose. Normal heel to shin. Able to ambulate with minimal assistance. Feels off balance but no gross ataxia.  Skin: Skin is warm and dry. He is not diaphoretic.  Nursing note and vitals reviewed.    ED Treatments / Results  Labs (all labs ordered are listed, but only abnormal results are displayed) Labs Reviewed  APTT - Abnormal; Notable for the following components:      Result Value   aPTT 41 (*)    All other components within normal limits  CBC - Abnormal; Notable for the following components:   MCH 34.1 (*)    All other components within normal limits  URINALYSIS, ROUTINE W REFLEX MICROSCOPIC - Abnormal; Notable for the following components:   Leukocytes, UA TRACE (*)    All other components within normal limits  URINALYSIS, MICROSCOPIC (REFLEX) - Abnormal; Notable for the following components:   Bacteria, UA FEW (*)    Squamous Epithelial / LPF 0-5 (*)    All other components within normal limits  ETHANOL  PROTIME-INR  DIFFERENTIAL    COMPREHENSIVE METABOLIC PANEL  TROPONIN I  RAPID URINE DRUG SCREEN, HOSP PERFORMED    EKG  EKG Interpretation  Date/Time:  Thursday March 23 2017 13:06:55 EDT Ventricular Rate:  74 PR Interval:    QRS Duration: 91 QT Interval:  417 QTC Calculation: 463 R Axis:   -33 Text Interpretation:  Sinus arrhythmia Borderline prolonged PR interval Left axis deviation no significant change since Dec 2016 Confirmed by Sherwood Gambler [phone]) on 03/23/2017 1:29:00 PM       Radiology Ct Head Wo Contrast  Result Date: 03/23/2017 CLINICAL DATA:  Ataxia.  Lightheadedness.  History of colon cancer. EXAM: CT HEAD WITHOUT CONTRAST TECHNIQUE: Contiguous axial images were obtained from the base of the skull through the vertex without intravenous contrast. COMPARISON:  08/05/2012 head CT. FINDINGS: Brain: There is an extra-axial 1.3 cm soft tissue mass in the anterior right posterior fossa (series 2/image 9), minimally increased from 1.2 cm on 06/16/2016 outside and brain MRI and new since 08/05/2012 head CT. No evidence of parenchymal hemorrhage or extra-axial fluid collection. No additional mass lesion, mass effect, or midline shift. No CT evidence of acute infarction. Nonspecific mild subcortical and periventricular white matter hypodensity, most in keeping with chronic small vessel ischemic change. Cerebral volume is age appropriate. No ventriculomegaly. Vascular: No acute abnormality. Skull: No evidence of calvarial fracture. Sinuses/Orbits: No fluid levels. Small mucous retention cyst versus polyp in the dependent left maxillary sinus. Other:  The mastoid air cells are unopacified. IMPRESSION: 1. No evidence of acute intracranial abnormality. 2. Extra-axial 1.3 cm soft tissue mass in the anterior right posterior fossa, minimally increased since 06/16/2016 outside brain MRI, new since 2014 head CT, most compatible  with a meningioma. 3. Mild chronic small vessel ischemic changes in the cerebral white matter.  Electronically Signed   By: Ilona Sorrel M.D.   On: 03/23/2017 14:59    Procedures Procedures (including critical care time)  Medications Ordered in ED Medications - No data to display   Initial Impression / Assessment and Plan / ED Course  I have reviewed the triage vital signs and the nursing notes.  Pertinent labs & imaging results that were available during my care of the patient were reviewed by me and considered in my medical decision making (see chart for details).     Patient is no longer on warfarin which explains why his INR is normal.  Otherwise his labs are benign.  He is able to ambulate but feels off balance still.  However he does walk without swelling or falling.  CT shows enlarged soft tissue mass in the right posterior fossa, likely meningioma.  I think with all of this as well as at risk for stroke he will need MRI.  We discussed this and discussed transfer to [facility] ED.  He understands this and wants to drive himself.  He states he is fine when driving.  I discussed the potential hazards of being off balance and driving and he understands.  He declines transfer via [facility].  Thus he will be transferred to the most common ED and will need an MRI tonight.  He understands to go straight to [facility]. D/w EDP, Dr. Johnney Killian to make aware of incoming patient.  Final Clinical Impressions(s) / ED Diagnoses   Final diagnoses:  Gait difficulty    ED Discharge Orders    None       Sherwood Gambler, MD 03/23/17 1544

## 2017-03-23 NOTE — ED Notes (Signed)
Patient denies pain and is resting comfortably.  

## 2017-03-23 NOTE — ED Notes (Signed)
Pt states he took a meclizine this morning without relief.

## 2017-03-23 NOTE — ED Provider Notes (Addendum)
Had difficulty with balance earlier today,, causing him to fall across his bed.  He is presently improved.  He was seen at [facility] earlier today where he had a head CT's.  He is sent here for further evaluation and MRI scan of the brain.  On exam he is alert speech is clear Glasgow Coma Score 15 answers appropriately cranial nerves II through XII grossly intact moves all extremities well motor strength 5/5 overall   Orlie Dakin, MD 03/23/17 1951 Pt signed out to Dr. Wyvonnia Dusky 12 midnight   Orlie Dakin, MD 03/24/17 0005

## 2017-03-23 NOTE — ED Notes (Signed)
Pt reports that he has had lightheadedness intermittently x 2 years. Pt states he has never had any "balance problems." Pt states the last night he developed balance problems that gradually became worse.

## 2017-03-23 NOTE — ED Notes (Signed)
Pt refused to remove pants at this time.

## 2017-03-23 NOTE — ED Triage Notes (Signed)
Pt reports ongoing issues with dizziness over the past 2 years. He states that yesterday and today he has had problems with balance. When he tried to stand up this morning he fell back across the bed because he could not balance. Denies weakness in arms or legs, speech is clear, equal grips.

## 2017-03-23 NOTE — ED Notes (Signed)
Called MRI. Pt is 3rd on list for MRI. Pt and family aware. Pt has no complaints. VSS.

## 2017-03-23 NOTE — ED Notes (Signed)
Pt going POV to [facility] ED for an MRI. Pt accompanied by friend to drive pt. Directions provided and pt instructed not to make any additional stops and to report directly to ED. Pt instructed to remain NPO, and not to tamper with IV. IV secured for transport.

## 2017-03-24 DIAGNOSIS — D320 Benign neoplasm of cerebral meninges: Secondary | ICD-10-CM | POA: Diagnosis not present

## 2017-03-24 MED ORDER — MECLIZINE HCL 25 MG PO TABS
25.0000 mg | ORAL_TABLET | Freq: Once | ORAL | Status: AC
  Administered 2017-03-24: 25 mg via ORAL
  Filled 2017-03-24: qty 1

## 2017-03-24 MED ORDER — GADOBENATE DIMEGLUMINE 529 MG/ML IV SOLN
20.0000 mL | Freq: Once | INTRAVENOUS | Status: AC | PRN
  Administered 2017-03-24: 20 mL via INTRAVENOUS

## 2017-03-24 MED ORDER — HYDROCODONE-ACETAMINOPHEN 5-325 MG PO TABS
1.0000 | ORAL_TABLET | Freq: Once | ORAL | Status: AC
  Administered 2017-03-24: 1 via ORAL
  Filled 2017-03-24: qty 1

## 2017-03-24 MED ORDER — MECLIZINE HCL 25 MG PO TABS: 25.0000 mg | ORAL_TABLET | Freq: Every day | ORAL | 0 refills | Status: AC | PRN

## 2017-03-24 NOTE — ED Provider Notes (Signed)
Patient awaiting MRI to assess for balance issues and difficulty walking.  MRI is negative for acute infarct.  Patient is able to ambulate.  He has no significant ataxia on finger to nose. MRI does confirm meningioma.  Patient will follow up with ENT as well as neurology.  Continue meclizine for vertigo.  Return precautions discussed.   Ezequiel Essex, MD 03/24/17 [phone]

## 2017-03-24 NOTE — Discharge Instructions (Signed)
There is no evidence of stroke.  Follow-up with the neurologist and ENT doctor.  Return to the ED if you develop new or worsening symptoms.

## 2017-03-24 NOTE — ED Notes (Signed)
Pt ready to leave. Not sure if he wants to wait for dc papers. Walked steady in the hallway.

## 2017-05-22 ENCOUNTER — Ambulatory Visit (INDEPENDENT_AMBULATORY_CARE_PROVIDER_SITE_OTHER): Payer: Medicare Other | Admitting: Neurology

## 2017-05-22 ENCOUNTER — Encounter: Payer: Self-pay | Admitting: Neurology

## 2017-05-22 VITALS — BP 155/85 | HR 80 | Ht 71.0 in | Wt 340.0 lb

## 2017-05-22 DIAGNOSIS — R2689 Other abnormalities of gait and mobility: Secondary | ICD-10-CM | POA: Diagnosis not present

## 2017-05-22 NOTE — Patient Instructions (Addendum)
I believe you have a sense of lightheadedness is in part due to blood pressure fluctuations.  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 Remember to drink enough water, call Dr. Camillo Flaming' office to get more advise regarding the amount of fluid you can have per day. Eat healthy meals and do not skip any meals. Try to eat protein with a every meal and eat a healthy snack such as fruit or nuts in between meals. Try to keep a regular sleep-wake schedule and try to exercise daily, particularly in the form of walking. Change positions slowly and you use your cane at all times.  I think it is a good idea for you to move into a retirement community close to your son in [place].  Unfortunately, there is not anything else I can offer you. I do not suggest any new testing from my end of things, you have had multiple tests from neurology. As far as your medications are concerned, I would like to suggest no new medications.

## 2017-05-22 NOTE — Progress Notes (Signed)
Subjective:    Patient ID: Victor Lucero is a 82 y.o. male.  HPI     Star Age, MD, PhD [facility] [address], [address] [place], [place] [address]  Dear Dr. Claiborne Billings,   I saw your patient, Victor Lucero, upon your kind request in my neurologic clinic today for consultation and second opinion of his dizziness. The patient is unaccompanied today. As you know, Mr. Victor Lucero is a 82 year old right-handed gentleman with an underlying complex medical history of hyperlipidemia, hypertension, hypothyroidism, arthritis, history of pulmonary embolism, sleep apnea, status post multiple surgeries including carpal tunnel surgery, cataract extractions, hip replacement, knee replacement, septoplasty, thyroid surgery, and morbid obesity with BMI of over 45, who reports intermittent spells of dizziness for the past several years. He often feels lightheaded upon standing, he has gait instability. He has seen different neurologists in the past, including Dr. Tonia Ghent and Dr. Macario Carls.  Workup over the course of years has been extensive. He had a brain MRI without contrast on 06/16/2016: CONCLUSION:  This study shows an area of decreased signal in the left posterior frontal region on the gradient echo image suggesting a possible remote hemorrhage. However no confirming abnormality is identified on any of the other sequences. The exact clinical significance of this abnormality is uncertain. This is identified. He has a transcranial Doppler studies, he has had EMG and nerve conduction testing as well. He had an EEG. He was diagnosed with gait disorder secondary to polyneuropathy and was also diagnosed with presyncope or syncope secondary to hypoxia. He has seen ENT. He has had carotid Doppler studies in the past. When he saw Dr. Tonia Ghent in April 2016, reportedly his balance issues and dizziness were ongoing for about 3 years at the time. He has had physical therapy. He has been on BiPAP  therapy for his sleep apnea. He has been treated with meclizine as needed. He had a brain MRI with and without contrast on 03/23/2017 and I reviewed the results: IMPRESSION: 1. No acute abnormality. 2. Mild small vessel disease. 3. 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  He has recently seen his pulmonologist for follow up for OSA and his pulm hypertension. He was counseled on weight and reducing his sodium intake and limiting his fluid intake, he was restarted on Lasix at the time. He reports that he now drinks about one small cup of water in the morning and 1 at night, he drinks tea for supper. He uses his auto BiPAP.he uses a cane, prefers this over the walker. He did not benefit from physical therapy. He has also done vestibular rehabilitation in the past. He is afraid of falling. He now has a call alert button, he is planning to move to independent living closer to his son who lives in [place]. He has a daughter in [place] and another son in [place]. Sadly, he lost his wife in January 2019.   His Past Medical History Is Significant For: Past Medical History:  Diagnosis Date  . Arthritis   . Cancer ([place])   . Colon cancer ([place])   . Dysphasia   . Hyperlipemia   . Hypertension   . Hypothyroidism   . Insomnia   . Pulmonary embolism ([place])   . PVC (premature ventricular contraction)   . S/P cardiac catheterization 2008   with minimal irregularities and a normal EF  . S/P TKR (total knee replacement)    R KNEE  . Sleep apnea    CPAP  Her Past Surgical History Is Significant For: Past Surgical History:  Procedure Laterality Date  . APPENDECTOMY  2003  . BOWEL     CANCER REMOVAL  . CARDIAC CATHETERIZATION  08/07/06  . CARPAL TUNNEL RELEASE    . THYROIDECTOMY  1970'S  . TONSILLECTOMY AND ADENOIDECTOMY     AGE 52  . TOTAL HIP ARTHROPLASTY Bilateral   . TOTAL KNEE ARTHROPLASTY  04/08/08   R KNEE    His Family History Is Significant For: Family History   Problem Relation Age of Onset  . Cancer Father        THYROID CA-DECEASED  . Stroke Father        DECEASED  . Heart attack Mother        DECEASED  . Hypertension Mother        DECEASED  . Heart attack Sister        DECEASED  . Hypertension Sister   . Cancer Sister        BRAIN TUMOR-DECEASE  . Hypertension Sister        DECEASED  . Cancer Sister        PANCREAS-DECEASED  . Hypertension Sister   . Parkinsonism Brother        DECEASED  . Heart attack Brother        DECEASED    His Social History Is Significant For: Social History   Socioeconomic History  . Marital status: Married    Spouse name: Not on file  . Number of children: Not on file  . Years of education: Not on file  . Highest education level: Not on file  Occupational History  . Not on file  Social Needs  . Financial resource strain: Not on file  . Food insecurity:    Worry: Not on file    Inability: Not on file  . Transportation needs:    Medical: Not on file    Non-medical: Not on file  Tobacco Use  . Smoking status: Former Smoker    Packs/day: 2.00    Years: 10.00    Pack years: 20.00    Types: Cigarettes    Last attempt to quit: 01/11/1975    Years since quitting: 42.3  . Smokeless tobacco: Never Used  Substance and Sexual Activity  . Alcohol use: No  . Drug use: No  . Sexual activity: Never  Lifestyle  . Physical activity:    Days per week: Not on file    Minutes per session: Not on file  . Stress: Not on file  Relationships  . Social connections:    Talks on phone: Not on file    Gets together: Not on file    Attends religious service: Not on file    Active member of club or organization: Not on file    Attends meetings of clubs or organizations: Not on file    Relationship status: Not on file  Other Topics Concern  . Not on file  Social History Narrative  . Not on file    His Allergies Are:  Allergies  Allergen Reactions  . Tamsulosin Other (See Comments)    Dizziness  .  Penicillins Rash    Has patient had a PCN reaction causing immediate rash, facial/tongue/throat swelling, SOB or lightheadedness with hypotension: Yes Has patient had a PCN reaction causing severe rash involving mucus membranes or skin necrosis: No Has patient had a PCN reaction that required hospitalization: No Has patient had a PCN reaction occurring within the last  10 years: No If all of the above answers are "NO", then may proceed with Cephalosporin use.   :   His Current Medications Are:  Outpatient Encounter Medications as of 05/22/2017  Medication Sig  . amLODipine (NORVASC) 5 MG tablet Take 5 mg by mouth at bedtime.   Marland Kitchen ammonium lactate (AMLACTIN) 12 % cream Apply topically daily as needed for dry skin  . aspirin 81 MG chewable tablet Chew 81 mg by mouth daily.   . finasteride (PROSCAR) 5 MG tablet Take 5 mg by mouth daily.  . fluticasone (FLONASE) 50 MCG/ACT nasal spray Instill 2 sprays into each nostril at bedtime  . ibuprofen (ADVIL,MOTRIN) 200 MG tablet Take 200 mg by mouth daily as needed (for knee pain).  Marland Kitchen ketoconazole (NIZORAL) 2 % shampoo Apply 1 application topically 2 (two) times a week.   . latanoprost (XALATAN) 0.005 % ophthalmic solution Place 1 drop into both eyes at bedtime.    Marland Kitchen levothyroxine (SYNTHROID, LEVOTHROID) 150 MCG tablet Take 150 mcg by mouth once a day before breakfast  . losartan (COZAAR) 100 MG tablet Take 100 mg by mouth daily.   . meclizine (ANTIVERT) 25 MG tablet Take 1 tablet (25 mg total) by mouth daily as needed for dizziness.  . mirtazapine (REMERON) 45 MG tablet Take 45 mg by mouth at bedtime  . ondansetron (ZOFRAN) 4 MG tablet Take 1 tablet (4 mg total) by mouth 4 (four) times daily as needed for nausea or vomiting.  Marland Kitchen oxyCODONE (ROXICODONE) 5 MG immediate release tablet Take 0.5-1 tablets (2.5-5 mg total) by mouth every 4 (four) hours as needed for severe pain.  . Probiotic CAPS Take 2 capsules by mouth daily.  Marland Kitchen UNABLE TO FIND CPAP: At  bedtime  . [DISCONTINUED] zolpidem (AMBIEN) 10 MG tablet Take 10 mg by mouth at bedtime as needed.     No facility-administered encounter medications on file as of 05/22/2017.   :   Review of Systems:  Out of a complete 14 point review of systems, all are reviewed and negative with the exception of these symptoms as listed below:  Review of Systems  Neurological:       Pt presents today to discuss his "light-headedness" and gait instability.    Objective:  Neurological Exam  Physical Exam Physical Examination:   Vitals:   05/22/17 1415  BP: (!) 155/85  Pulse: 80   General Examination: The patient is a very pleasant 82 y.o. male in no acute distress. He appears well-developed and well-nourished and well groomed.  On orthostatic testing, he was not able to do his blood pressure lying down as he did not feel comfortable lying down. He did not have a significant drop in blood pressure from sitting to standing, had a systolic drop of 13 points, otherwise no significant change. He had no significant lightheadedness upon standing but reported feeling fullness in his head upon sitting. He denies any vertiginous symptoms.  HEENT: Normocephalic, atraumatic, pupils are equal, round and reactive to light and accommodation. He wears corrective eyeglasses. Hearing is mildly impaired. Face is symmetric, no facial masking, speech is clear. Airway examination reveals mild to moderate mouth dryness, adequate dental hygiene, moderate airway crowding. Tongue protrudes centrally and palate elevates symmetrically.  Chest: Clear to auscultation without wheezing, rhonchi or crackles noted.  Heart: S1+S2+0, regular and normal without murmurs, rubs or gallops noted.   Abdomen: Soft, non-tender and non-distended with normal bowel sounds appreciated on auscultation.  Extremities: There is 1+ pitting edema in  the distal lower extremities bilaterally, Left more than right.  Skin: Warm, mildly erythematous,  particularly his arms, quite dry and chronic sun exposure type changes noted throughout the arms and distal legs.  Musculoskeletal: exam reveals no obvious joint deformities, tenderness or joint swelling or erythema.   Neurologically:  Mental status: The patient is awake, alert and oriented in all 4 spheres. His immediate and remote memory, attention, language skills and fund of knowledge are appropriate. There is no evidence of aphasia, agnosia, apraxia or anomia. Speech is clear with normal prosody and enunciation. Thought process is linear. Mood is normal and affect is mood-congruent.  Cranial nerves II - XII are as described above under HEENT exam. In addition: shoulder shrug is normal with equal shoulder height noted. Motor exam: Normal bulk, strength and tone is noted. There is no tremor. Reflexes are diminished throughout. Romberg is not testable safely. Fine motor skills are mildly impaired throughout, no lateralization.   Cerebellar testing: No dysmetria or intention tremor on finger to nose testing. Heel to shin is unremarkable bilaterally. There is no truncal or gait ataxia.  Sensory exam: intact to light touch in the upper and lower extremities.  Gait, station and balance: He stands with difficulty. No veering to one side is noted. No leaning to one side is noted. Posture is age-appropriate and stance is wide-based. He walks slowly and cautiously, has a 3-point cane.  Assessment and Plan:   In summary, Anibal Quinby Pepitone is a very pleasant 82 y.o.-year old male with an underlying complex medical history of hyperlipidemia, hypertension, hypothyroidism, arthritis, history of pulmonary embolism, sleep apnea, status post multiple surgeries including carpal tunnel surgery, cataract extractions, hip replacement, knee replacement, septoplasty, thyroid surgery, and morbid obesity with BMI of over 45, who reports intermittent spells of dizziness for the past several years. He has had workup with  neurology, workup with ENT, he has seen cardiology in the past but not recently. Since he describes lightheadedness upon standing, he may benefit from seeing cardiologist again. He has also seen pulmonology recently for his home in very hypertension and chronic respiratory failure with hypoxia as well as obstructive sleep apnea on auto BiPAP. Unfortunately, there is no a whole lot I can add at this time. He has had recent brain MRI testing which showed an incidental small meningioma. He is advised that he can consider seeing a neurosurgeon at some point but this was certainly an incidental small finding. He does not seem to have any symptoms from this. He is advised that his gait disorder and dizziness is most likely a multifactorial issue. He is advised to stay better hydrated and get more advice regarding how much he needs to limit his water intake or how much he is allowed to drink. He is advised to use his cane at all times and change positions slowly, be compliant with his auto BiPAP and work on weight loss. Contributing factors are obesity, multiple joint replacements and prior fall with injury with fear of falling. He has done physical therapy, he has had vestibular rehabilitation in the past as well. Neither one of these therapies helped in his perception. At this juncture I suggested he follow-up with his current providers, I will see him back on an as-needed basis. Of note, he is planning to move closer to his son in [place] which is a good idea as he has been living alone since his wife passed away unexpectedly in 07-Mar-2017.  From my end of things I did  not suggest any diagnostic testing or new medications. I answered all his questions today and the patient was in agreement.   Thank you very much for allowing me to participate in the care of this nice patient. If I can be of any further assistance to you please do not hesitate to call me at [phone].  Sincerely,   Star Age, MD,  PhD

## 2017-06-08 ENCOUNTER — Ambulatory Visit (INDEPENDENT_AMBULATORY_CARE_PROVIDER_SITE_OTHER): Payer: Medicare Other | Admitting: Podiatry

## 2017-06-08 ENCOUNTER — Encounter: Payer: Self-pay | Admitting: Podiatry

## 2017-06-08 DIAGNOSIS — M79672 Pain in left foot: Secondary | ICD-10-CM

## 2017-06-08 DIAGNOSIS — L600 Ingrowing nail: Secondary | ICD-10-CM

## 2017-06-08 DIAGNOSIS — B351 Tinea unguium: Secondary | ICD-10-CM | POA: Diagnosis not present

## 2017-06-08 DIAGNOSIS — M79671 Pain in right foot: Secondary | ICD-10-CM

## 2017-06-08 NOTE — Progress Notes (Signed)
Subjective: 82 y.o. year old male patient presents complaining of painful nails. Patient requests toe nails trimmed.   Objective: Dermatologic: Thick yellow dystrophic deformed nails x 10. Dry cracking skin on both feet. Vascular: Pedal pulses are all palpable. Orthopedic: Contracted lesser digits 2-5 bilateral. Neurologic: Loss of protective sensations both feet.  Assessment: Dystrophic mycotic nails x 10. Xerotic skin bilateral. Peripheral neuropathy.  Treatment: Both feet soaked for 10 minutes. All mycotic nails, corns, calluses debrided.  Return in 3 months or as needed.

## 2017-06-08 NOTE — Patient Instructions (Signed)
Seen for hypertrophic nails. All nails debrided. Return as needed.  

## 2017-06-22 ENCOUNTER — Encounter: Payer: Self-pay | Admitting: Podiatry

## 2017-06-22 ENCOUNTER — Ambulatory Visit (INDEPENDENT_AMBULATORY_CARE_PROVIDER_SITE_OTHER): Payer: Medicare Other | Admitting: Podiatry

## 2017-06-22 DIAGNOSIS — L600 Ingrowing nail: Secondary | ICD-10-CM | POA: Diagnosis not present

## 2017-06-22 DIAGNOSIS — M79671 Pain in right foot: Secondary | ICD-10-CM

## 2017-06-22 NOTE — Patient Instructions (Signed)
Seen for painful nail 4th right. Foot soaked and offending border removed. Pain relieved. Return as needed.

## 2017-06-22 NOTE — Progress Notes (Signed)
Subjective: 82 y.o. year old male patient presents complaining of pain on 4th toe right.  Stated that he had ingrown nail trimmed on 06/08/17 and the toe pain did not stop.  Objective: Dermatologic: Thick yellow deformed nails. Ingrown nail 4th right with pain. Xerotic skin both feet. Vascular: Pedal pulses are all palpable. Orthopedic: Contracted lesser digits 2-5 bilateral. Neurologic: Subjective numbness on both feet.  Assessment: Dystrophic mycotic nails x 10. Xerotic skin. Ingrown nail 4th right. Peripheral neuropathy.  Treatment: Right foot soaked. Offending nail removed from 4th toe right. Pain was relieved. Patient is in process of moving to [place]. May return as needed.

## 2017-07-04 ENCOUNTER — Ambulatory Visit (INDEPENDENT_AMBULATORY_CARE_PROVIDER_SITE_OTHER): Payer: Medicare Other | Admitting: Podiatry

## 2017-07-04 ENCOUNTER — Encounter: Payer: Self-pay | Admitting: Podiatry

## 2017-07-04 DIAGNOSIS — M79672 Pain in left foot: Secondary | ICD-10-CM

## 2017-07-04 DIAGNOSIS — M79671 Pain in right foot: Secondary | ICD-10-CM

## 2017-07-04 DIAGNOSIS — B351 Tinea unguium: Secondary | ICD-10-CM

## 2017-07-04 DIAGNOSIS — L600 Ingrowing nail: Secondary | ICD-10-CM

## 2017-07-04 NOTE — Patient Instructions (Addendum)
Seen for painful toes 4th bilateral. Both feet soaked for 15 minutes. Offending borders resected on 4th toes bilateral. All other nails also debrided. Dry fissured skin and hard callused skin removed and Vitamin A cream applied. Return as needed.

## 2017-07-04 NOTE — Progress Notes (Signed)
Subjective: 82 y.o. year old male patient presents complaining of ingrown nail pain on 4th toes both feet. He feels the pain is coming from ingrown nails.  Objective: Dermatologic: Thick yellow deformed nails x 10.  Ingrown nails 4th bilateral without drainage or open skin. Fissured calluses and hard dry skin both feet plantar. Vascular: Pedal pulses are all palpable. Bilateral lower limb edema. Orthopedic: Contracted lesser digits 2-5 bilateral. Neurologic: Subjective numbness on both feet.  Assessment: Dystrophic mycotic nails x 10. Ingrown nail 4th bilateral with pain. Peripheral neuropathy.   Treatment: Both feet soaked for 15 minutes.Offending borders resected on 4th toes bilateral. All other nails also debrided. Dry fissured and scaly skin removed and followed with Vitamin A cream. Return as needed.

## 2018-10-21 IMAGING — CT CT HEAD W/O CM
3 series · 14 of 47 positions shown, 16 images · non-contrast
Comparison: 08/05/2012 head CT.

CLINICAL DATA: Ataxia.  Lightheadedness.  History of colon cancer.

EXAM:
CT HEAD WITHOUT CONTRAST
TECHNIQUE: Contiguous axial images were obtained from the base of the skull
through the vertex without intravenous contrast.

[Series 2: head wo · axial · 0.48mm/px · z∈[+1092,+1232]mm · 8 of 34 slices shown, 10 images]
[im 3/34  brain]
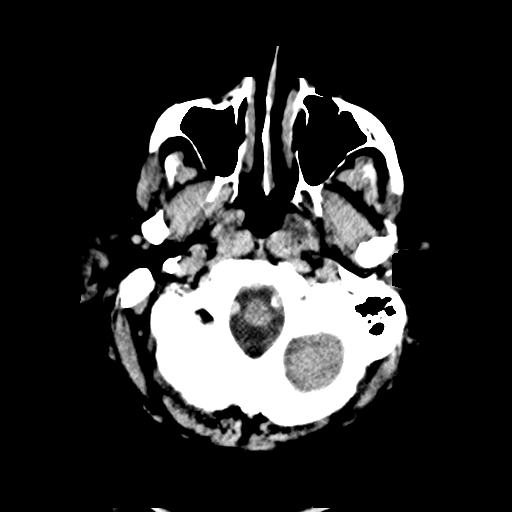
[im 3/34  bone]
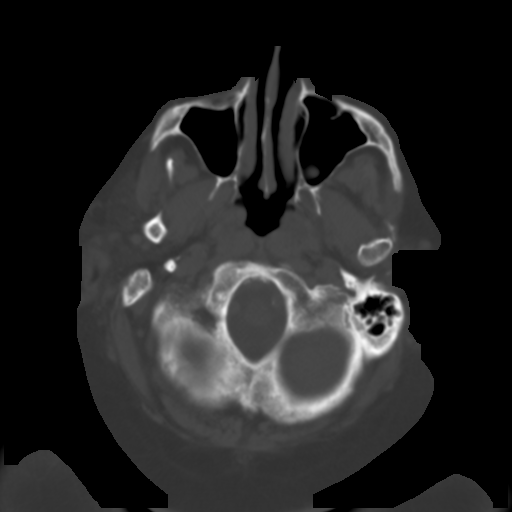
[im 7/34  brain]
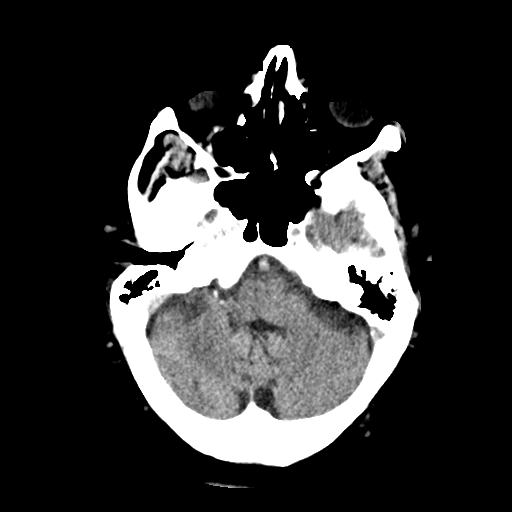
[im 11/34  brain]
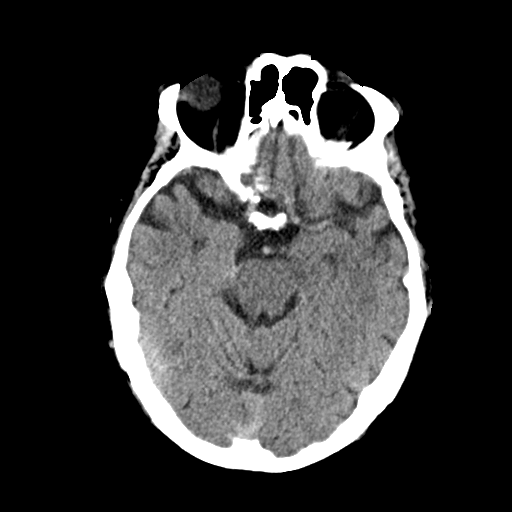
[im 15/34  brain]
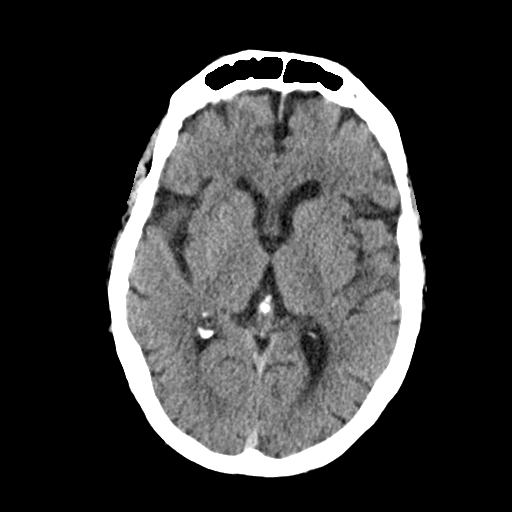
[im 19/34  brain]
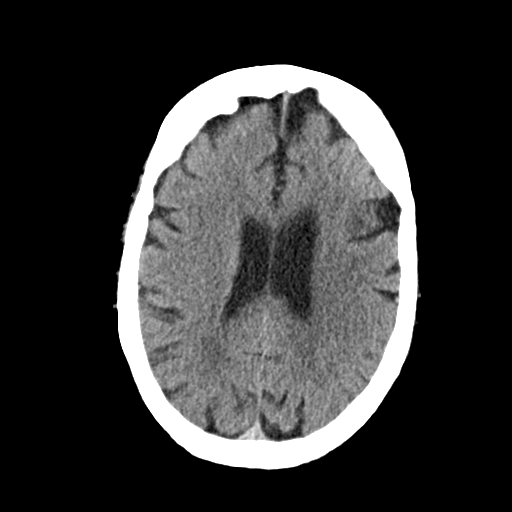
[im 19/34  bone]
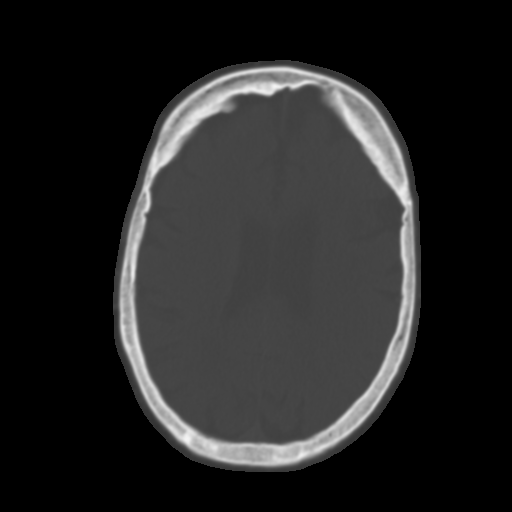
[im 23/34  brain]
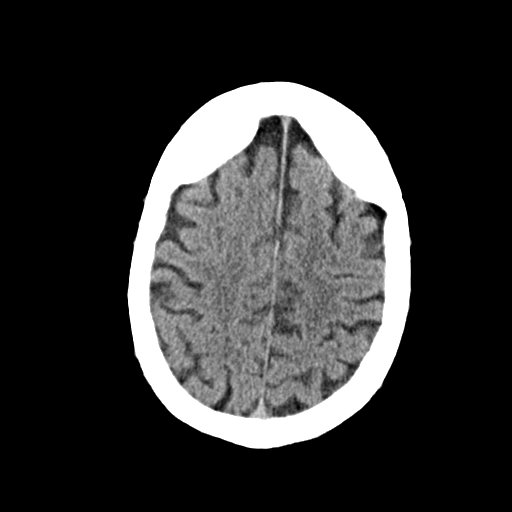
[im 27/34  brain]
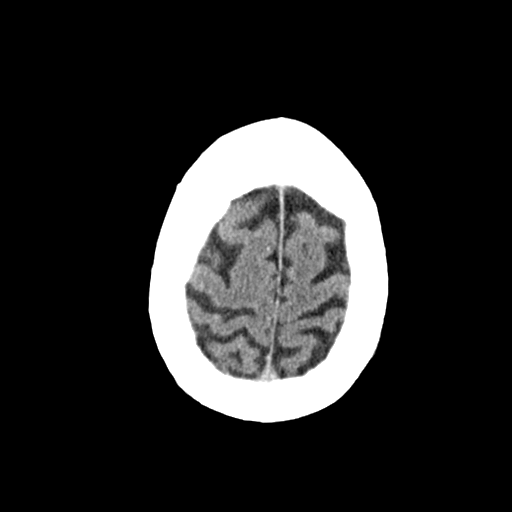
[im 31/34  brain]
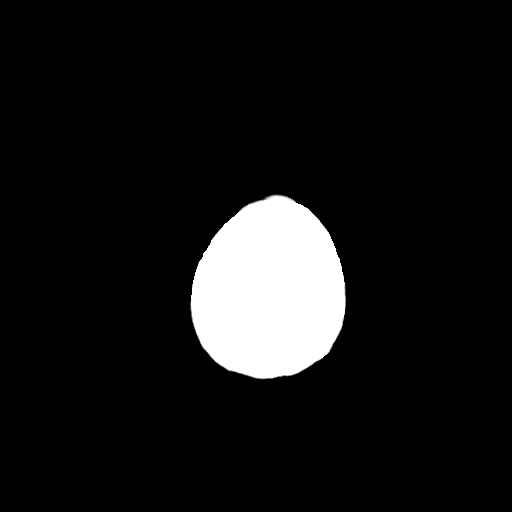

[Series 4: coronal soft · coronal · 0.33mm/px · 3 of 74 slices shown]
[im 25/74  brain]
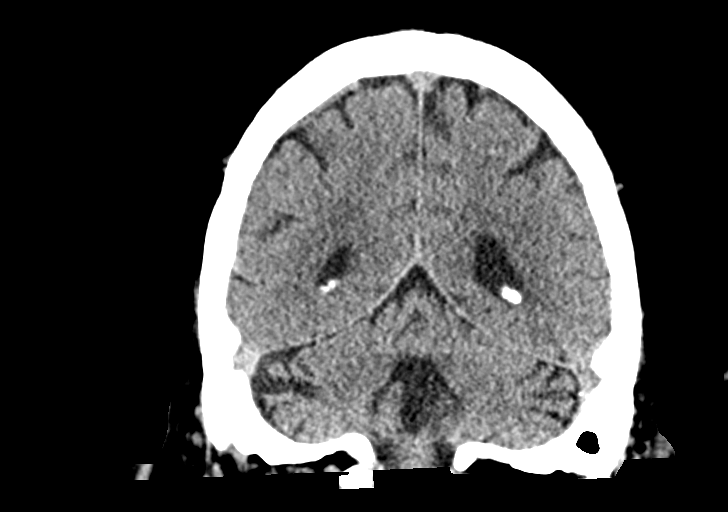
[im 33/74  brain]
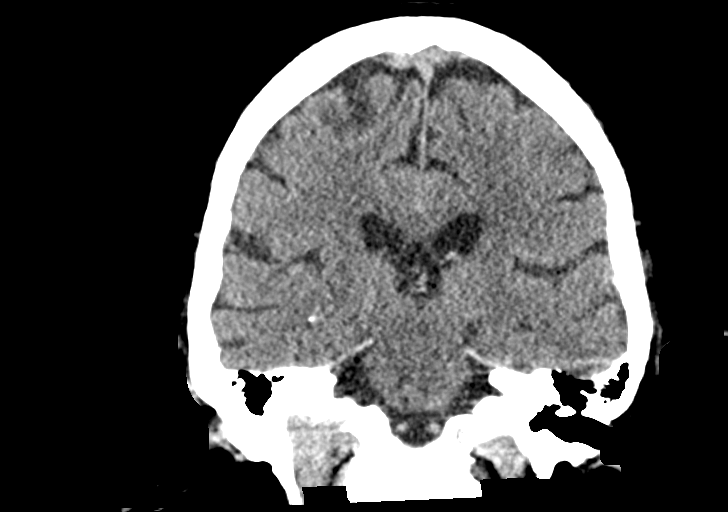
[im 41/74  brain]
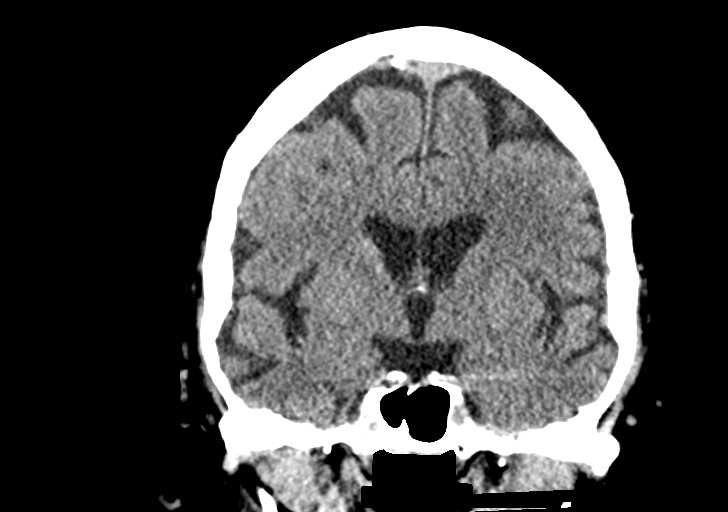

[Series 5: sag soft · sagittal · 0.34mm/px · 3 of 59 slices shown]
[im 20/59  brain]
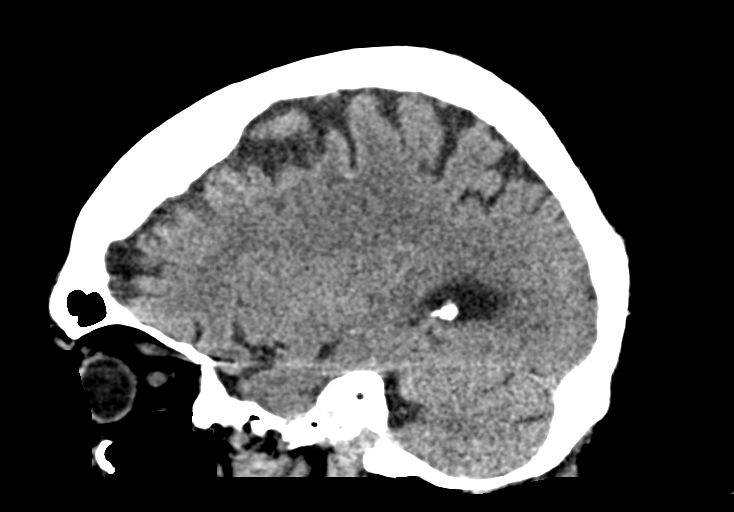
[im 30/59  brain]
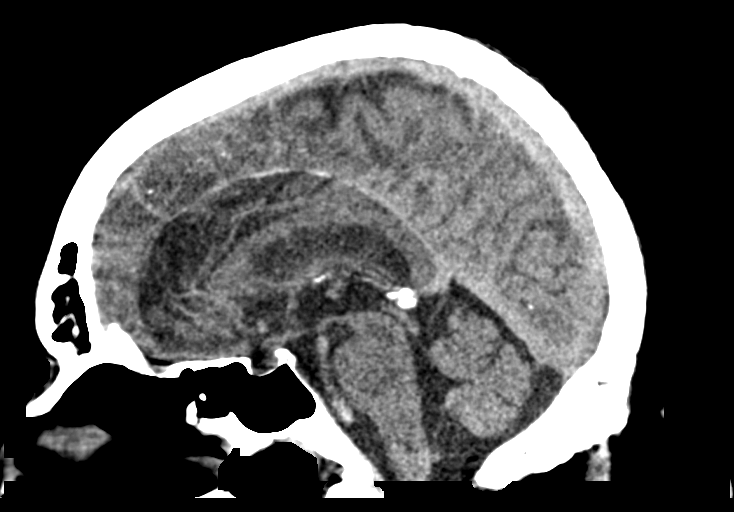
[im 39/59  brain]
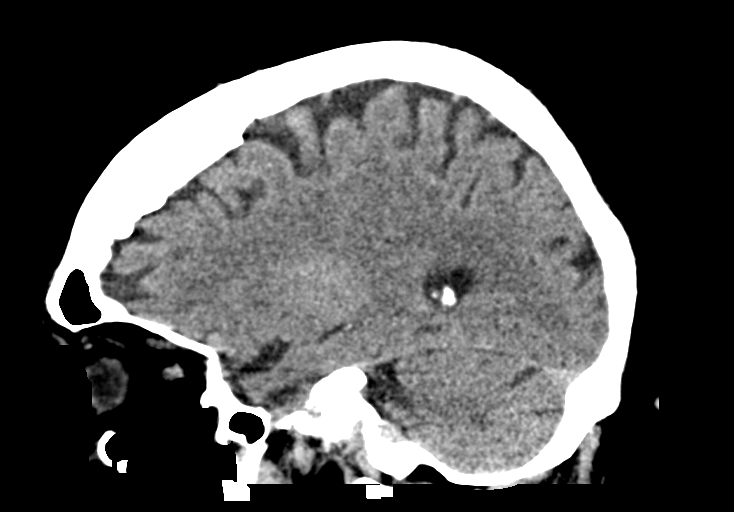

[14 of 47 positions shown; findings below may reference images not displayed]

FINDINGS: Brain: There is an extra-axial 1.3 cm soft tissue mass in the
anterior right posterior fossa (series 2/image 9), minimally
increased from 1.2 cm on 06/16/2016 outside and brain MRI and new
since 08/05/2012 head CT. No evidence of parenchymal hemorrhage or
extra-axial fluid collection. No additional mass lesion, mass
effect, or midline shift. No CT evidence of acute infarction.
Nonspecific mild subcortical and periventricular white matter
hypodensity, most in keeping with chronic small vessel ischemic
change. Cerebral volume is age appropriate. No ventriculomegaly.

Vascular: No acute abnormality.

Skull: No evidence of calvarial fracture.

Sinuses/Orbits: No fluid levels. Small mucous retention cyst versus
polyp in the dependent left maxillary sinus.

Other:  The mastoid air cells are unopacified.
IMPRESSION: 1. No evidence of acute intracranial abnormality.
2. Extra-axial 1.3 cm soft tissue mass in the anterior right
posterior fossa, minimally increased since 06/16/2016 outside brain
MRI, new since 7087 head CT, most compatible with a meningioma.
3. Mild chronic small vessel ischemic changes in the cerebral white
matter.

## 2018-10-22 IMAGING — MR MR HEAD WO/W CM
10 of 13 series · 32 of 48 positions shown · IV contrast (Yes MH)
Comparison: Brain MRI 06/16/2016

Head CT 03/23/2017

CLINICAL DATA: Ataxia and dizziness.  History of colon cancer.

EXAM:
MRI HEAD WITHOUT AND WITH CONTRAST
TECHNIQUE: Multiplanar, multiecho pulse sequences of the brain and surrounding
structures were obtained without and with intravenous contrast.
CONTRAST:  20 mL gadobenate dimeglumine (MULTIHANCE) injection

[Series 5: DWI · axial · 3.0mm · 1.02mm/px · z∈[-95,+52]mm · 7 of 100 slices shown (1 of 2)]
[im 1/100]
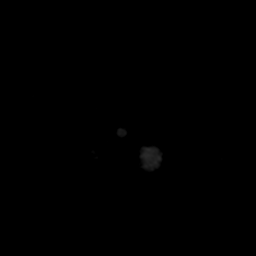
[im 17/100]
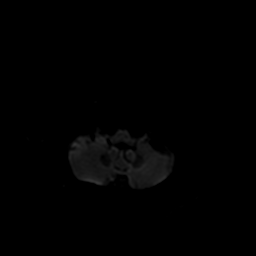
[im 34/100]
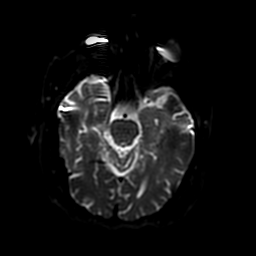
[im 50/100]
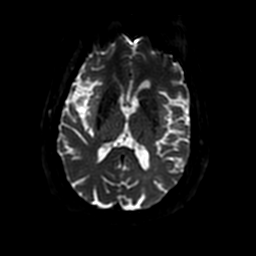
[im 67/100]
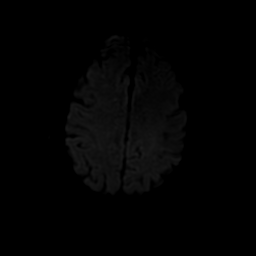
[im 83/100]
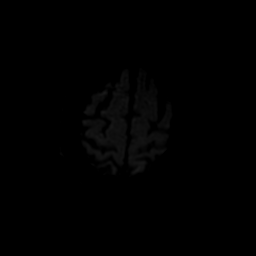
[im 100/100]
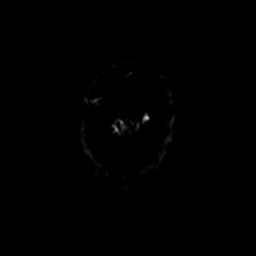

[Series 6: DWI · coronal · 5.0mm · 1.02mm/px · 5 of 72 slices shown (2 of 2)]
[im 1/72]
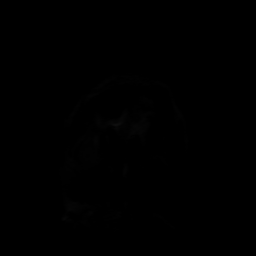
[im 18/72]
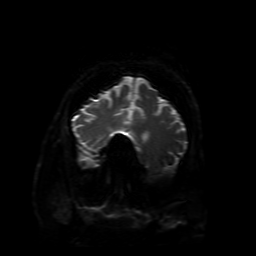
[im 36/72]
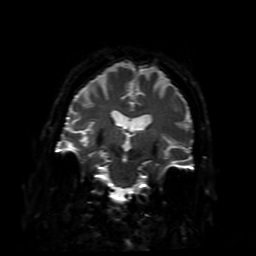
[im 54/72]
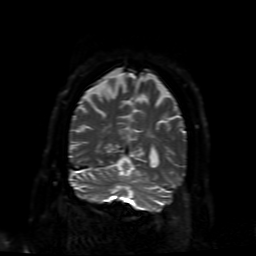
[im 72/72]
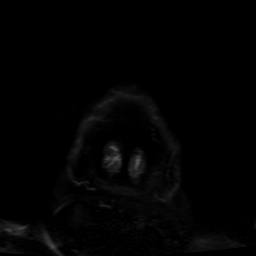

[Series 7: FLAIR · sagittal · 5.0mm · 0.47mm/px · 2 of 24 slices shown (1 of 3)]
[im 1/24]
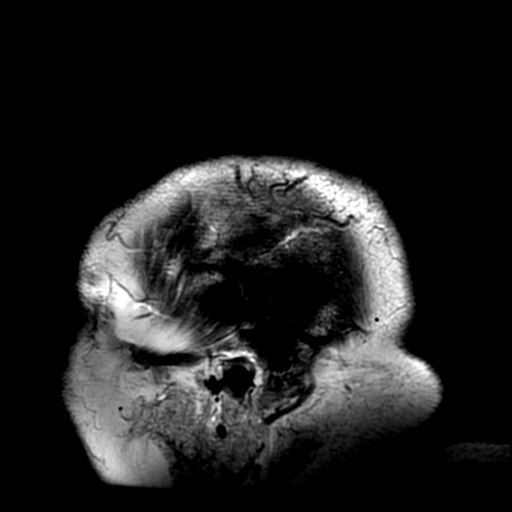
[im 24/24]
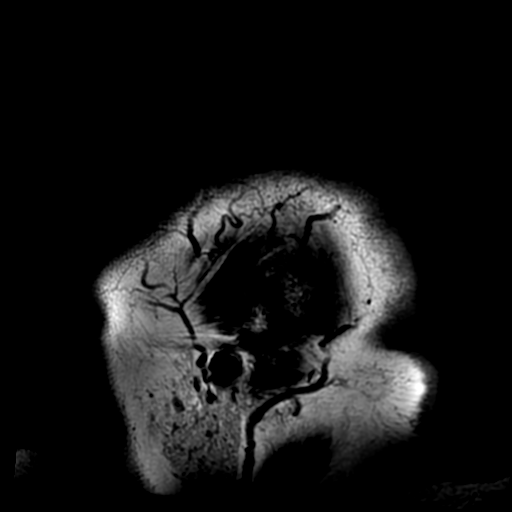

[Series 8: T2 · axial · 5.0mm · 0.47mm/px · z∈[-100,+55]mm · 2 of 27 slices shown]
[im 1/27]
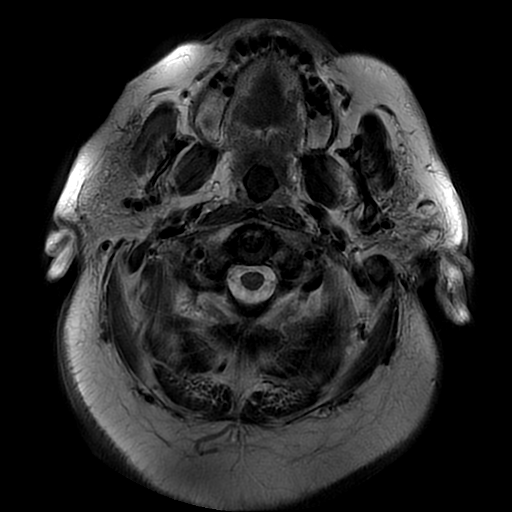
[im 27/27]
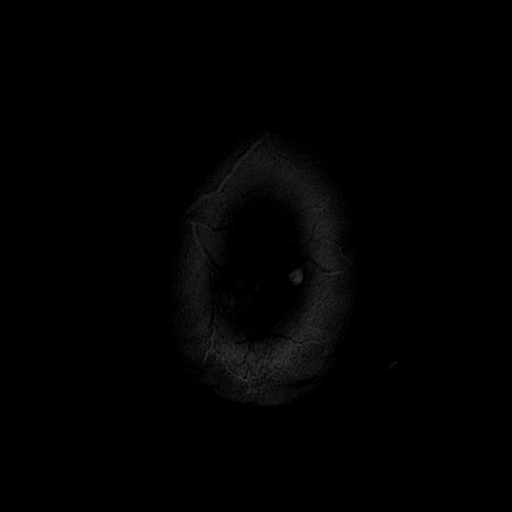

[Series 9: FLAIR · axial · 5.0mm · 0.47mm/px · z∈[-100,+55]mm · 2 of 27 slices shown (2 of 3)]
[im 1/27]
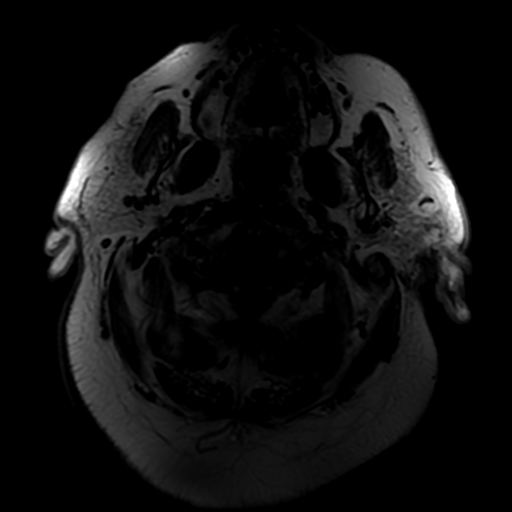
[im 27/27]
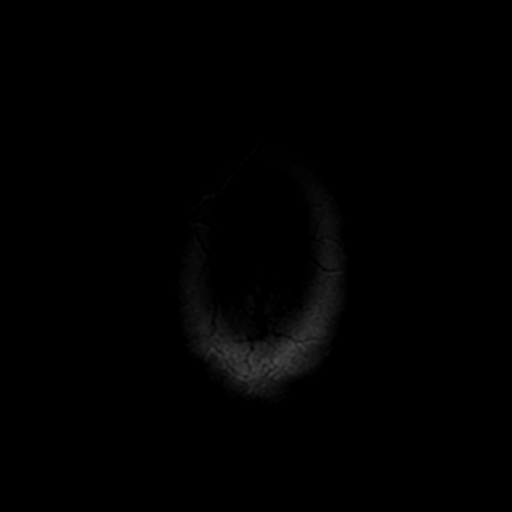

[Series 12: T2 post-contrast · coronal · 5.0mm · 0.39mm/px · 3 of 36 slices shown]
[im 1/36]
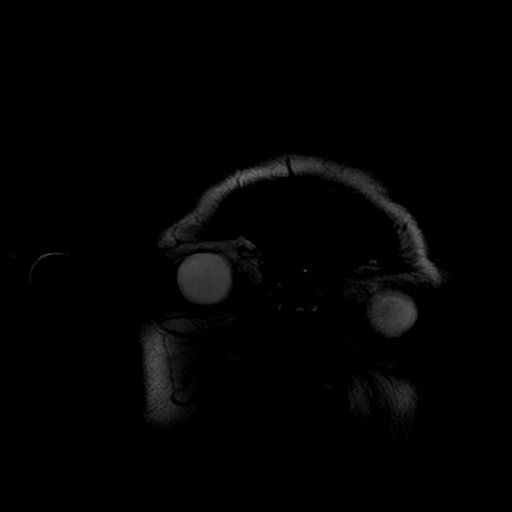
[im 18/36]
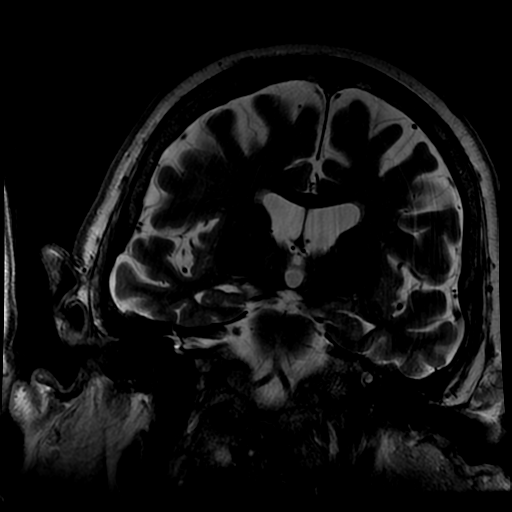
[im 36/36]
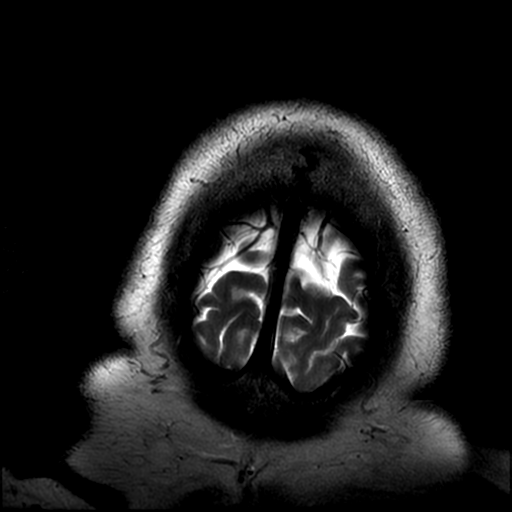

[Series 14: T1 post-contrast · coronal · 5.0mm · 0.43mm/px · 2 of 30 slices shown]
[im 1/30]
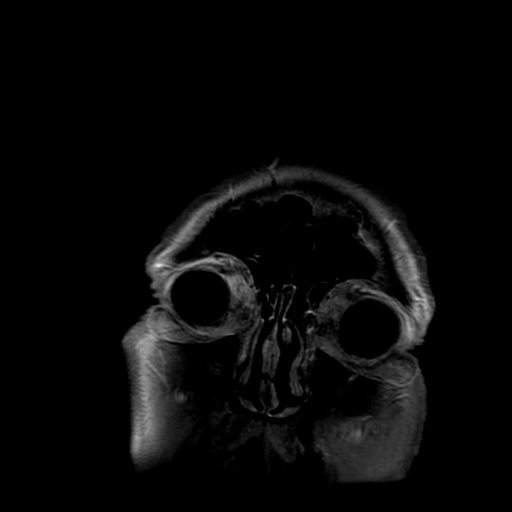
[im 30/30]
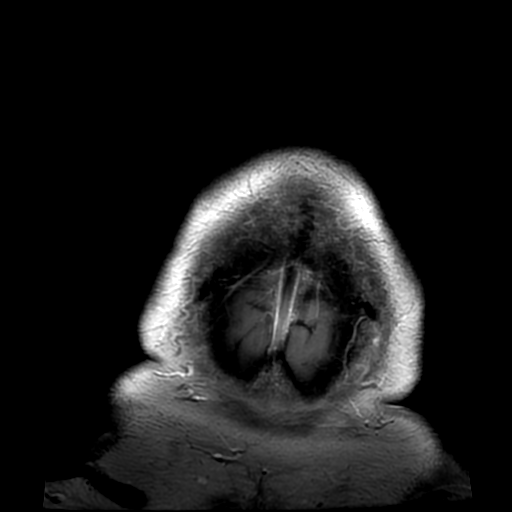

[Series 15: FLAIR · sagittal · 5.0mm · 0.47mm/px · 2 of 24 slices shown (3 of 3)]
[im 1/24]
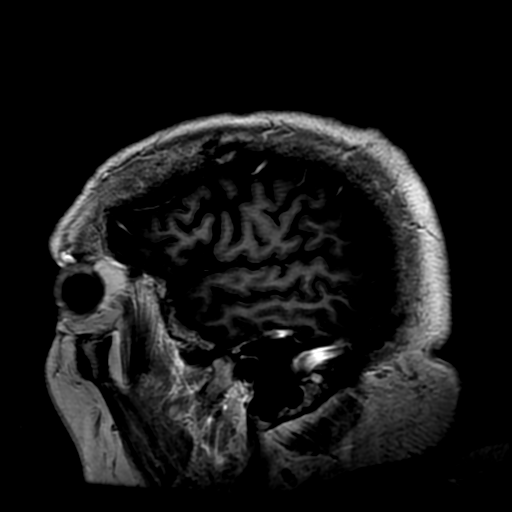
[im 24/24]
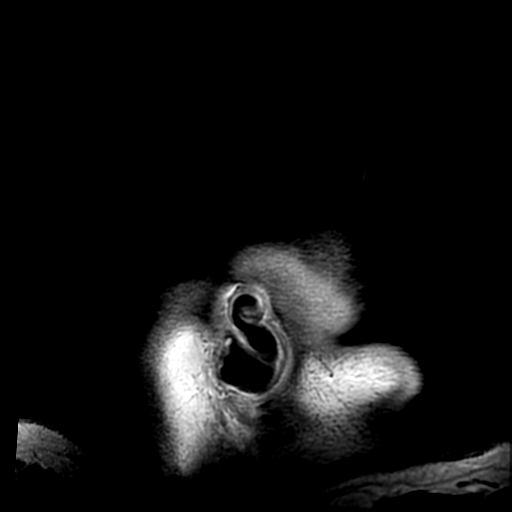

[Series 550: ADC · axial · 3.0mm · 1.02mm/px · z∈[-95,+52]mm · 4 of 50 slices shown (1 of 2)]
[im 1/50]
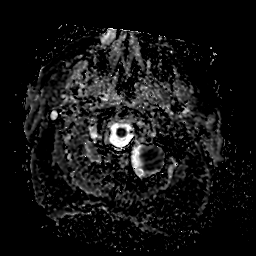
[im 17/50]
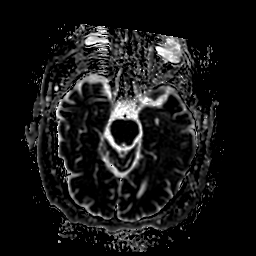
[im 33/50]
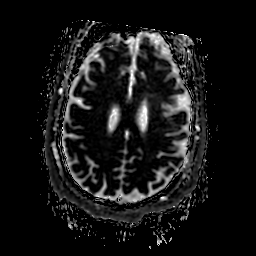
[im 50/50]
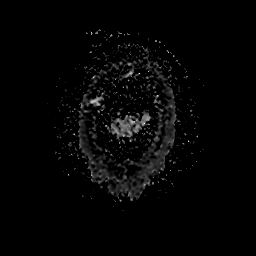

[Series 650: ADC · coronal · 5.0mm · 1.02mm/px · 3 of 36 slices shown (2 of 2)]
[im 1/36]
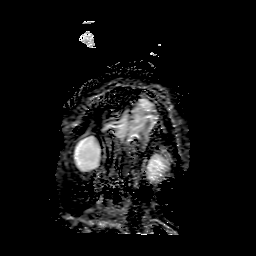
[im 18/36]
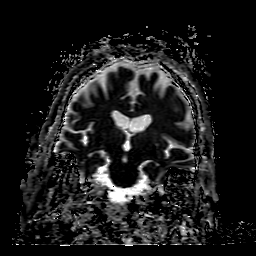
[im 36/36]
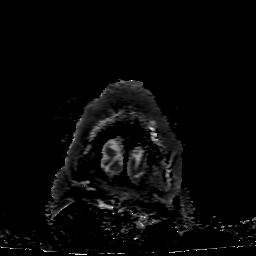

[32 of 48 positions shown; findings below may reference images not displayed]

FINDINGS: Brain: The midline structures are normal. There is no acute infarct
or acute hemorrhage. Meningioma projecting inferiorly from the right
tentorial leaflet measures 12 mm. Multifocal white matter
hyperintensity, most commonly due to chronic ischemic
microangiopathy. No age-advanced or lobar predominant atrophy. No
chronic microhemorrhage or superficial siderosis.

Vascular: Major intracranial arterial and venous sinus flow voids
are preserved.

Skull and upper cervical spine: The visualized skull base,
calvarium, upper cervical spine and extracranial soft tissues are
normal.

Sinuses/Orbits: No fluid levels or advanced mucosal thickening. No
mastoid or middle ear effusion. Normal orbits.
IMPRESSION: 1. No acute abnormality.
2. Mild small vessel disease.
3. Right cerebellopontine angle meningioma measuring 12 mm.

## 2023-05-11 DEATH — deceased
